# Patient Record
Sex: Female | Born: 1935 | Race: White | Hispanic: No | Smoking: Never smoker
Health system: Southern US, Community
[De-identification: ages and names within clinical notes are randomized; demographics above are authoritative.]

## PROBLEM LIST (undated history)

## (undated) ENCOUNTER — Emergency Department (HOSPITAL_COMMUNITY): Payer: Medicare Other

## (undated) DIAGNOSIS — K2100 Gastro-esophageal reflux disease with esophagitis, without bleeding: Secondary | ICD-10-CM

## (undated) DIAGNOSIS — F329 Major depressive disorder, single episode, unspecified: Secondary | ICD-10-CM

## (undated) DIAGNOSIS — E7800 Pure hypercholesterolemia, unspecified: Secondary | ICD-10-CM

## (undated) DIAGNOSIS — F32A Depression, unspecified: Secondary | ICD-10-CM

## (undated) DIAGNOSIS — F0390 Unspecified dementia without behavioral disturbance: Secondary | ICD-10-CM

## (undated) DIAGNOSIS — H3530 Unspecified macular degeneration: Secondary | ICD-10-CM

## (undated) HISTORY — PX: HERNIA REPAIR: SHX51

## (undated) HISTORY — PX: EYE SURGERY: SHX253

## (undated) HISTORY — PX: HIP SURGERY: SHX245

## (undated) HISTORY — PX: VEIN LIGATION AND STRIPPING: SHX2653

## (undated) HISTORY — PX: CARPAL TUNNEL RELEASE: SHX101

## (undated) HISTORY — DX: Unspecified macular degeneration: H35.30

---

## 1997-10-15 ENCOUNTER — Ambulatory Visit (HOSPITAL_COMMUNITY): Admission: RE | Admit: 1997-10-15 | Discharge: 1997-10-15 | Payer: Self-pay | Admitting: Obstetrics & Gynecology

## 1998-04-18 ENCOUNTER — Ambulatory Visit (HOSPITAL_COMMUNITY): Admission: RE | Admit: 1998-04-18 | Discharge: 1998-04-18 | Payer: Self-pay | Admitting: Family Medicine

## 1998-05-09 ENCOUNTER — Ambulatory Visit (HOSPITAL_COMMUNITY): Admission: RE | Admit: 1998-05-09 | Discharge: 1998-05-09 | Payer: Self-pay | Admitting: Family Medicine

## 1998-10-21 ENCOUNTER — Ambulatory Visit (HOSPITAL_COMMUNITY): Admission: RE | Admit: 1998-10-21 | Discharge: 1998-10-21 | Payer: Self-pay | Admitting: *Deleted

## 1999-05-29 ENCOUNTER — Ambulatory Visit (HOSPITAL_COMMUNITY): Admission: RE | Admit: 1999-05-29 | Discharge: 1999-05-29 | Payer: Self-pay | Admitting: Family Medicine

## 1999-11-10 ENCOUNTER — Ambulatory Visit (HOSPITAL_COMMUNITY): Admission: RE | Admit: 1999-11-10 | Discharge: 1999-11-10 | Payer: Self-pay | Admitting: Obstetrics & Gynecology

## 2000-06-11 ENCOUNTER — Encounter: Payer: Self-pay | Admitting: Obstetrics & Gynecology

## 2000-06-11 ENCOUNTER — Ambulatory Visit (HOSPITAL_COMMUNITY): Admission: RE | Admit: 2000-06-11 | Discharge: 2000-06-11 | Payer: Self-pay | Admitting: Obstetrics & Gynecology

## 2000-10-25 ENCOUNTER — Ambulatory Visit (HOSPITAL_COMMUNITY): Admission: RE | Admit: 2000-10-25 | Discharge: 2000-10-25 | Payer: Self-pay | Admitting: Obstetrics & Gynecology

## 2001-06-20 ENCOUNTER — Ambulatory Visit (HOSPITAL_COMMUNITY): Admission: RE | Admit: 2001-06-20 | Discharge: 2001-06-20 | Payer: Self-pay | Admitting: Obstetrics & Gynecology

## 2001-10-10 ENCOUNTER — Ambulatory Visit (HOSPITAL_COMMUNITY): Admission: RE | Admit: 2001-10-10 | Discharge: 2001-10-10 | Payer: Self-pay | Admitting: *Deleted

## 2002-06-23 ENCOUNTER — Encounter: Payer: Self-pay | Admitting: Family Medicine

## 2002-06-23 ENCOUNTER — Ambulatory Visit (HOSPITAL_COMMUNITY): Admission: RE | Admit: 2002-06-23 | Discharge: 2002-06-23 | Payer: Self-pay | Admitting: Family Medicine

## 2002-10-09 ENCOUNTER — Ambulatory Visit (HOSPITAL_COMMUNITY): Admission: RE | Admit: 2002-10-09 | Discharge: 2002-10-09 | Payer: Self-pay | Admitting: *Deleted

## 2004-07-30 ENCOUNTER — Emergency Department: Payer: Self-pay | Admitting: Emergency Medicine

## 2004-11-27 ENCOUNTER — Ambulatory Visit: Payer: Self-pay | Admitting: Family Medicine

## 2005-01-01 ENCOUNTER — Ambulatory Visit: Payer: Self-pay | Admitting: General Surgery

## 2006-02-13 ENCOUNTER — Ambulatory Visit: Payer: Self-pay | Admitting: Family Medicine

## 2006-04-23 ENCOUNTER — Ambulatory Visit: Payer: Self-pay | Admitting: Unknown Physician Specialty

## 2006-05-01 ENCOUNTER — Ambulatory Visit: Payer: Self-pay | Admitting: Unknown Physician Specialty

## 2006-10-08 ENCOUNTER — Encounter: Payer: Self-pay | Admitting: Unknown Physician Specialty

## 2006-11-28 ENCOUNTER — Encounter: Payer: Self-pay | Admitting: Unknown Physician Specialty

## 2006-12-04 ENCOUNTER — Ambulatory Visit: Payer: Self-pay | Admitting: Unknown Physician Specialty

## 2006-12-17 ENCOUNTER — Ambulatory Visit: Payer: Self-pay | Admitting: Unknown Physician Specialty

## 2006-12-19 ENCOUNTER — Ambulatory Visit: Payer: Self-pay | Admitting: Podiatry

## 2007-05-29 ENCOUNTER — Ambulatory Visit: Payer: Self-pay | Admitting: Family Medicine

## 2008-06-15 ENCOUNTER — Ambulatory Visit: Payer: Self-pay | Admitting: Unknown Physician Specialty

## 2008-06-15 ENCOUNTER — Ambulatory Visit: Payer: Self-pay | Admitting: Family Medicine

## 2008-08-03 ENCOUNTER — Ambulatory Visit: Payer: Self-pay | Admitting: Anesthesiology

## 2008-08-26 ENCOUNTER — Ambulatory Visit: Payer: Self-pay | Admitting: Anesthesiology

## 2008-09-13 ENCOUNTER — Ambulatory Visit: Payer: Self-pay | Admitting: Anesthesiology

## 2008-10-25 ENCOUNTER — Ambulatory Visit: Payer: Self-pay | Admitting: Anesthesiology

## 2008-11-26 ENCOUNTER — Ambulatory Visit: Payer: Self-pay | Admitting: Anesthesiology

## 2008-12-15 ENCOUNTER — Ambulatory Visit: Payer: Self-pay | Admitting: Anesthesiology

## 2009-02-07 ENCOUNTER — Ambulatory Visit: Payer: Self-pay | Admitting: Anesthesiology

## 2009-03-16 ENCOUNTER — Ambulatory Visit: Payer: Self-pay | Admitting: Anesthesiology

## 2009-05-19 ENCOUNTER — Ambulatory Visit: Payer: Self-pay | Admitting: Family Medicine

## 2009-06-16 ENCOUNTER — Ambulatory Visit: Payer: Self-pay | Admitting: Family Medicine

## 2010-07-11 ENCOUNTER — Ambulatory Visit: Payer: Self-pay | Admitting: Family Medicine

## 2011-07-18 ENCOUNTER — Ambulatory Visit: Payer: Self-pay | Admitting: Family Medicine

## 2011-07-18 LAB — LIPID PANEL
CHOLESTEROL: 181 mg/dL (ref 0–200)
HDL: 76 mg/dL — AB (ref 35–70)
LDL Cholesterol: 90 mg/dL
Triglycerides: 75 mg/dL (ref 40–160)

## 2011-08-23 ENCOUNTER — Ambulatory Visit: Payer: Self-pay | Admitting: Family Medicine

## 2012-03-17 ENCOUNTER — Ambulatory Visit: Payer: Self-pay | Admitting: Family Medicine

## 2012-04-14 ENCOUNTER — Ambulatory Visit: Payer: Self-pay | Admitting: Surgery

## 2012-04-14 DIAGNOSIS — Z01810 Encounter for preprocedural cardiovascular examination: Secondary | ICD-10-CM

## 2012-04-18 ENCOUNTER — Ambulatory Visit: Payer: Self-pay | Admitting: Surgery

## 2012-11-27 ENCOUNTER — Other Ambulatory Visit: Payer: Self-pay | Admitting: Rheumatology

## 2012-11-27 LAB — SYNOVIAL CELL COUNT + DIFF, W/ CRYSTALS
Basophil: 0 %
Crystals, Joint Fluid: NONE SEEN
Eosinophil: 1 %
Lymphocytes: 32 %
Neutrophils: 36 %
Nucleated Cell Count: 111 /mm3
Other Cells BF: 0 %
Other Mononuclear Cells: 31 %

## 2013-03-16 ENCOUNTER — Ambulatory Visit: Payer: Self-pay | Admitting: Family Medicine

## 2013-03-23 ENCOUNTER — Encounter: Payer: Self-pay | Admitting: Family Medicine

## 2013-04-08 ENCOUNTER — Encounter: Payer: Self-pay | Admitting: Family Medicine

## 2014-01-27 LAB — CBC AND DIFFERENTIAL
HCT: 38 % (ref 36–46)
Hemoglobin: 13.1 g/dL (ref 12.0–16.0)
Neutrophils Absolute: 3 /uL
Platelets: 240 10*3/uL (ref 150–399)
WBC: 6.5 10^3/mL

## 2014-01-27 LAB — BASIC METABOLIC PANEL
BUN: 27 mg/dL — AB (ref 4–21)
CREATININE: 0.9 mg/dL (ref 0.5–1.1)
Glucose: 84 mg/dL
Potassium: 4.3 mmol/L (ref 3.4–5.3)
SODIUM: 144 mmol/L (ref 137–147)

## 2014-01-27 LAB — HEPATIC FUNCTION PANEL
ALT: 19 U/L (ref 7–35)
AST: 20 U/L (ref 13–35)
Alkaline Phosphatase: 68 U/L (ref 25–125)

## 2014-01-27 LAB — TSH: TSH: 0.87 u[IU]/mL (ref 0.41–5.90)

## 2014-10-01 ENCOUNTER — Inpatient Hospital Stay: Payer: Self-pay | Admitting: Internal Medicine

## 2014-10-01 ENCOUNTER — Ambulatory Visit
Admit: 2014-10-01 | Disposition: A | Discharge status: Home / Self Care | Payer: Self-pay | Admitting: Orthopedic Surgery

## 2014-10-01 ENCOUNTER — Ambulatory Visit: Payer: Self-pay | Admitting: Family Medicine

## 2014-10-01 LAB — URINALYSIS, COMPLETE
Bacteria: NONE SEEN
Bilirubin,UR: NEGATIVE
Blood: NEGATIVE
Glucose,UR: NEGATIVE mg/dL (ref 0–75)
Hyaline Cast: 8
Ketone: NEGATIVE
Leukocyte Esterase: NEGATIVE
Nitrite: NEGATIVE
Ph: 5 (ref 4.5–8.0)
Protein: NEGATIVE
RBC,UR: NONE SEEN /HPF (ref 0–5)
Specific Gravity: 1.024 (ref 1.003–1.030)
Squamous Epithelial: 1
WBC UR: NONE SEEN /HPF (ref 0–5)

## 2014-10-01 LAB — COMPREHENSIVE METABOLIC PANEL
Albumin: 3.6 g/dL
Alkaline Phosphatase: 76 U/L
Anion Gap: 6 — ABNORMAL LOW (ref 7–16)
BUN: 25 mg/dL — ABNORMAL HIGH
Bilirubin,Total: 0.9 mg/dL
Calcium, Total: 8.8 mg/dL — ABNORMAL LOW
Chloride: 107 mmol/L
Co2: 27 mmol/L
Creatinine: 0.71 mg/dL
EGFR (African American): >60
EGFR (Non-African Amer.): >60
Glucose: 100 mg/dL — ABNORMAL HIGH
Potassium: 3.5 mmol/L
SGOT(AST): 25 U/L
SGPT (ALT): 19 U/L
Sodium: 140 mmol/L
Total Protein: 6.7 g/dL

## 2014-10-01 LAB — CBC
HCT: 39.9 % (ref 35.0–47.0)
HGB: 13.1 g/dL (ref 12.0–16.0)
MCH: 29.5 pg (ref 26.0–34.0)
MCHC: 32.8 g/dL (ref 32.0–36.0)
MCV: 90 fL (ref 80–100)
Platelet: 241 10*3/uL (ref 150–440)
RBC: 4.43 10*6/uL (ref 3.80–5.20)
RDW: 13.4 % (ref 11.5–14.5)
WBC: 7.4 10*3/uL (ref 3.6–11.0)

## 2014-10-01 LAB — PROTIME-INR
INR: 1.1
Prothrombin Time: 14.3 secs

## 2014-10-01 LAB — APTT: Activated PTT: 29.7 secs (ref 23.6–35.9)

## 2014-10-02 LAB — CBC WITH DIFFERENTIAL/PLATELET
BASOS PCT: 1.2 %
Basophil #: 0.1 10*3/uL (ref 0.0–0.1)
EOS ABS: 0.2 10*3/uL (ref 0.0–0.7)
Eosinophil %: 3.4 %
HCT: 38.5 % (ref 35.0–47.0)
HGB: 12.7 g/dL (ref 12.0–16.0)
LYMPHS ABS: 2.1 10*3/uL (ref 1.0–3.6)
Lymphocyte %: 32.1 %
MCH: 29.6 pg (ref 26.0–34.0)
MCHC: 32.9 g/dL (ref 32.0–36.0)
MCV: 90 fL (ref 80–100)
MONO ABS: 0.8 x10 3/mm (ref 0.2–0.9)
Monocyte %: 12 %
NEUTROS ABS: 3.3 10*3/uL (ref 1.4–6.5)
Neutrophil %: 51.3 %
Platelet: 245 10*3/uL (ref 150–440)
RBC: 4.28 10*6/uL (ref 3.80–5.20)
RDW: 13.4 % (ref 11.5–14.5)
WBC: 6.4 10*3/uL (ref 3.6–11.0)

## 2014-10-02 LAB — BASIC METABOLIC PANEL
Anion Gap: 5 — ABNORMAL LOW (ref 7–16)
BUN: 17 mg/dL
CALCIUM: 8.8 mg/dL — AB
Chloride: 108 mmol/L
Co2: 26 mmol/L
Creatinine: 0.57 mg/dL
EGFR (African American): >60
Glucose: 94 mg/dL
Potassium: 3.2 mmol/L — ABNORMAL LOW
Sodium: 139 mmol/L

## 2014-10-03 LAB — CBC WITH DIFFERENTIAL/PLATELET
BASOS PCT: 0.7 %
Basophil #: 0.1 10*3/uL (ref 0.0–0.1)
EOS PCT: 1 %
Eosinophil #: 0.1 10*3/uL (ref 0.0–0.7)
HCT: 39.5 % (ref 35.0–47.0)
HGB: 12.9 g/dL (ref 12.0–16.0)
Lymphocyte #: 1.4 10*3/uL (ref 1.0–3.6)
Lymphocyte %: 15.3 %
MCH: 29.3 pg (ref 26.0–34.0)
MCHC: 32.8 g/dL (ref 32.0–36.0)
MCV: 89 fL (ref 80–100)
MONOS PCT: 13.2 %
Monocyte #: 1.2 x10 3/mm — ABNORMAL HIGH (ref 0.2–0.9)
Neutrophil #: 6.3 10*3/uL (ref 1.4–6.5)
Neutrophil %: 69.8 %
PLATELETS: 257 10*3/uL (ref 150–440)
RBC: 4.42 10*6/uL (ref 3.80–5.20)
RDW: 13.1 % (ref 11.5–14.5)
WBC: 8.9 10*3/uL (ref 3.6–11.0)

## 2014-10-03 LAB — BASIC METABOLIC PANEL
ANION GAP: 7 (ref 7–16)
BUN: 10 mg/dL
CALCIUM: 8.5 mg/dL — AB
Chloride: 104 mmol/L
Co2: 27 mmol/L
Creatinine: 0.47 mg/dL
EGFR (African American): >60
EGFR (Non-African Amer.): >60
Glucose: 117 mg/dL — ABNORMAL HIGH
Potassium: 3 mmol/L — ABNORMAL LOW
Sodium: 138 mmol/L

## 2014-10-04 LAB — CBC WITH DIFFERENTIAL/PLATELET
Basophil #: 0.1 10*3/uL (ref 0.0–0.1)
Basophil %: 0.9 %
EOS ABS: 0.2 10*3/uL (ref 0.0–0.7)
Eosinophil %: 2.3 %
HCT: 34.7 % — AB (ref 35.0–47.0)
HGB: 11.5 g/dL — ABNORMAL LOW (ref 12.0–16.0)
LYMPHS ABS: 1.9 10*3/uL (ref 1.0–3.6)
LYMPHS PCT: 25 %
MCH: 29.6 pg (ref 26.0–34.0)
MCHC: 33.1 g/dL (ref 32.0–36.0)
MCV: 89 fL (ref 80–100)
Monocyte #: 1 x10 3/mm — ABNORMAL HIGH (ref 0.2–0.9)
Monocyte %: 13.2 %
NEUTROS ABS: 4.5 10*3/uL (ref 1.4–6.5)
Neutrophil %: 58.6 %
Platelet: 262 10*3/uL (ref 150–440)
RBC: 3.88 10*6/uL (ref 3.80–5.20)
RDW: 13.1 % (ref 11.5–14.5)
WBC: 7.7 10*3/uL (ref 3.6–11.0)

## 2014-10-04 LAB — BASIC METABOLIC PANEL
Anion Gap: 7 (ref 7–16)
BUN: 11 mg/dL
CO2: 24 mmol/L
Calcium, Total: 8.3 mg/dL — ABNORMAL LOW
Chloride: 108 mmol/L
Creatinine: 0.55 mg/dL
EGFR (African American): >60
GLUCOSE: 97 mg/dL
Potassium: 3.3 mmol/L — ABNORMAL LOW
Sodium: 139 mmol/L

## 2014-10-04 LAB — MAGNESIUM: Magnesium: 1.7 mg/dL

## 2014-10-05 ENCOUNTER — Encounter
Admit: 2014-10-05 | Disposition: A | Discharge status: Home / Self Care | Payer: Self-pay | Attending: Internal Medicine | Admitting: Internal Medicine

## 2014-10-05 LAB — CBC WITH DIFFERENTIAL/PLATELET
BASOS ABS: 0.1 10*3/uL (ref 0.0–0.1)
BASOS PCT: 0.7 %
EOS ABS: 0.1 10*3/uL (ref 0.0–0.7)
Eosinophil %: 1.1 %
HCT: 34.2 % — AB (ref 35.0–47.0)
HGB: 11.6 g/dL — ABNORMAL LOW (ref 12.0–16.0)
LYMPHS PCT: 22.8 %
Lymphocyte #: 2 10*3/uL (ref 1.0–3.6)
MCH: 30.2 pg (ref 26.0–34.0)
MCHC: 34 g/dL (ref 32.0–36.0)
MCV: 89 fL (ref 80–100)
MONOS PCT: 13 %
Monocyte #: 1.1 x10 3/mm — ABNORMAL HIGH (ref 0.2–0.9)
Neutrophil #: 5.4 10*3/uL (ref 1.4–6.5)
Neutrophil %: 62.4 %
Platelet: 300 10*3/uL (ref 150–440)
RBC: 3.85 10*6/uL (ref 3.80–5.20)
RDW: 13 % (ref 11.5–14.5)
WBC: 8.7 10*3/uL (ref 3.6–11.0)

## 2014-10-05 LAB — BASIC METABOLIC PANEL
Anion Gap: 6 — ABNORMAL LOW (ref 7–16)
BUN: 8 mg/dL
CHLORIDE: 106 mmol/L
CREATININE: 0.55 mg/dL
Calcium, Total: 8.5 mg/dL — ABNORMAL LOW
Co2: 25 mmol/L
EGFR (African American): >60
EGFR (Non-African Amer.): >60
Glucose: 114 mg/dL — ABNORMAL HIGH
Potassium: 3.6 mmol/L
SODIUM: 137 mmol/L

## 2014-10-08 ENCOUNTER — Encounter
Admit: 2014-10-08 | Disposition: A | Discharge status: Home / Self Care | Payer: Self-pay | Attending: Internal Medicine | Admitting: Internal Medicine

## 2014-10-09 LAB — URINALYSIS, COMPLETE
BILIRUBIN, UR: NEGATIVE
Bacteria: NONE SEEN
Blood: NEGATIVE
GLUCOSE, UR: NEGATIVE mg/dL (ref 0–75)
Nitrite: NEGATIVE
Ph: 5 (ref 4.5–8.0)
Protein: 30
RBC,UR: 3 /HPF (ref 0–5)
SPECIFIC GRAVITY: 1.028 (ref 1.003–1.030)
Squamous Epithelial: 7
Transitional Epi: 2

## 2014-10-11 LAB — URINE CULTURE

## 2014-10-26 NOTE — Op Note (Signed)
PATIENT NAME:  Lynn [name], Lynn [name] MR#:  [id number] DATE OF BIRTH:  25-Jan-1927  DATE OF PROCEDURE:  04/18/2012  PREOPERATIVE DIAGNOSIS: Left inguinal hernia.   POSTOPERATIVE DIAGNOSIS: Left inguinal hernia.   PROCEDURE: Left inguinal hernia repair.   SURGEON: Renda RollsWilton Smith, MD   ANESTHESIA: General.   INDICATIONS: This 81 year old female recently has had bulging in the left groin, and an inguinal hernia was demonstrated on physical exam and repair recommended for definitive treatment.   DESCRIPTION OF PROCEDURE: The patient was placed on the operating table in the supine position under general anesthesia. The abdomen was prepared with ChloraPrep and draped in [name] sterile manner. [name] left lower quadrant transversely oriented suprapubic incision was made some 4 cm in length, carried down through subcutaneous tissues. One traversing vein was divided between 4-0 chromic ligatures. Scarpa's fascia was incised. Numerous small bleeding points were cauterized. The external oblique aponeurosis was incised along the course of its fibers exposing an indirect hernia sac. The sac and round ligament were dissected free from surrounding structures. The round ligament was ligated distally with 4-0 Vicryl suture ligature and divided. The sac was dissected free from its surrounding structures. It was opened. Its continuity with the peritoneal cavity was demonstrated. The sac was some 5 cm in length and was dissected up into the internal ring. [name] high ligation of the sac and the round ligament was done with [name] 4-0 Vicryl suture ligature. The sac was excised along with [name] portion of round ligament that did not need to be sent for pathology. The stump was allowed to retract. Next, the repair was carried out with [name] row of 0 Surgilon sutures suturing the conjoined tendon to the shelving edge of the inguinal ligament, incorporating transversalis fascia into the repair. The last stitch led to obliteration of the internal ring. Next, the  cut edges of the external oblique aponeurosis were closed with running 4-0 Vicryl. The fascia superior and lateral to the repair site was infiltrated with 0.5% Sensorcaine with epinephrine. Subcutaneous tissues were infiltrated. Next, the Scarpa's fascia was closed with interrupted 4-0 Vicryl. The skin was closed with [name] running 5-0 Monocryl subcuticular suture and Dermabond. The patient tolerated surgery satisfactorily and was then prepared for transfer to the recovery room.    ____________________________ Shela CommonsJ. Renda RollsWilton Smith, MD jws:cbb D: 04/18/2012 10:48:30 ET T: 04/18/2012 10:59:48 ET JOB#: [id number]  cc: Adella HareJ. Wilton Smith, MD, <Dictator> Adella HareWILTON J SMITH MD ELECTRONICALLY SIGNED 04/25/2012 18:53

## 2014-11-07 ENCOUNTER — Encounter
Admission: RE | Admit: 2014-11-07 | Discharge: 2014-11-07 | Disposition: A | Discharge status: Home / Self Care | Payer: Commercial Managed Care - HMO | Source: Ambulatory Visit | Attending: Internal Medicine | Admitting: Internal Medicine

## 2014-11-07 NOTE — Consult Note (Signed)
Brief Consult Note: Diagnosis: L femoral neck fracture.   Patient was seen by consultant.   Recommend to proceed with surgery or procedure.   Comments: HPI:  [age] F with no PMH, reports falling 2 weeks ago while taking care of her husband. She states she fell onto her left hip and back, but denies any pain at the time. Denies head trauma, denies LOC, denies presyncope. She was able to ambulate but noted over the coming days that she had left hip pain when ambulating. She eventually needed a walker to ambulate, and today went to see her PCP who obtained an Xray that revealed an impacted L hip femoral neck fracture, and she was sent to the ED.  She lives at home, takes care of herself and her husband, and ambulates without assistive devices at baseline.   PMH: Denies PSH: R hernia repair 2 years ago NKDA  PE:  LLE - no deformity of leg with near symmetric leg lengths Scissoring of toes, varicose veins in lower leg Unable to SLR Mild pain with log roll, no pain with axial load DP2+ PT 2+ No calf pain, compartments soft 5/5 EHL/FHL/TA/GS SILT DP/SP/T BCR  Xray - L hip with valgus impacted femoral neck fracture  Impression: L hip femoral neck fracture, valgus impacted  Plan: ORIF with cannulated screws -NPO after midnight -Hold DVT chemoprophylaxis -Preop labs - needs T&S for OR  -Foley catheter -Medical clearance -Bed rest -Pain control -informed consent was obtained, I had a long discussion with her regarding the risks, benefits and alternatives to cannulated screw fixation of her left hip. I explained to her the risk of eventual conversion to hemiarthroplasty or total hip arthroplasty if cannulated screw fixation fails or AVN develops. There is also a small chance the hip fracture displaced and this is noticed on the OR table, and hemiarthroplasty must be done instead of cannulated screw fixation. She verbalized understanding, all questions were answered.  Electronic  Signatures: Deatra JamesKahn, Machai Desmith D (MD)  (Signed 26-Mar-16 00:15)  Authored: Brief Consult Note   Last Updated: 26-Mar-16 00:15 by Deatra JamesKahn, Ihsan Nomura D (MD)

## 2014-11-07 NOTE — Op Note (Signed)
Patient: This 81 year old Female had a surgical procedure performed on 02-Oct-2014.  Post Operative Report:  Pre-Op Diagnosis L femoral neck fracture   Post-Op Diagnosis Same   Operation L hip cannulated screw fixation   Anesthesia Spinal   Specimen Type None   Findings impacted femoral neck fracture   Surgeon Andee PolesKahn, Advika Mclelland   EBL: Minimal   Complications None   Description of Procedure: Mrs. Lynn [name] is an [age] F who after a fall 2 weeks ago developed worsening L hip pain, and was diagnosed with an impacted L femoral neck fracture. After discussing the r/b/a, in order to prevent further displacement and allow early mobilization she was indicated for percutaneous screw fixation.   Consents and clearances were reviewed on the day of surgery. The surgical site was marked, and after being evaluated by anesthesia the patient was taken to the OR. In the OR she recieved spinal anesthesia and was transferred to the fracture table and positioned with the L leg in gentle traction. The R leg was padded and secured to the beam of the bed.  The L leg was prepped and draped and a time out was performed. Fluoroscopy showed the fracture remained impacted. Using a guide pin for the 7.3 mm cannulated screw set, an inferior screw was planned with fluoroscopy and once a starting point was chosen along the lateral femoral cortex above the level of the inferior aspect of the lesser trochanter a small stab incision was made and the pin was placed against the femur, and angled to project along the neck into the head of the femur. When the angle was sufficient on the AP xray, the guide pin was driven into the head of the femur along the calcar using a pin driver. Lateral fluoroscopy showed the pin to be well placed and aligned. It was driven further so it was close but not through the femoral head on both the AP and lateral views. THen a second superior wire was placed in a similar fashion, superior and anterior to the  first. With it well placed on AP and lateral xrays, a third wire was placed using the same technique superior and posterior to the first, and directly posterior to the anterior superior wire and aligned in the same plane. On the lateral view the inferior and superior pins were aligned in the same plane. With all three wires placed and location and depth appropriate, stab incisions were lengthened, and depth guage was used to plan for screw lengths. An inferior 80 mm screw was placed with 16 mm threads and a washer. It was placed under power and not tightened completely. The superior screws, measuring 75 mm in length, with 16 mm threads and washers, were placed one by one. None of the screws were tightened until all three were in place, then they were tightened one by one using a hand driver. Fluoroscopy in multiple views showed the implants well positioned within the neck and femoral head. Wounds were irrigated, and closed with 2-0 vicryl and staples.  Sterile compressive dressing was placed, sponge and needle counts were correct at the end of the case. The patinet was taken to the recovery room in stable condition   Electronic Signatures: Deatra JamesKahn, Michi Herrmann D (MD)  (Signed 26-Mar-16 22:49)  Authored: Patient and Date/Time, Operative Note   Last Updated: 26-Mar-16 22:49 by Deatra JamesKahn, Kynisha Memon D (MD)

## 2014-11-07 NOTE — Consult Note (Signed)
Brief Consult Note: Diagnosis: L hip femoral neck fracture.   Comments: Postop check L femoral neck fracture  Patient reports doing well, says pain improved since surgery. Nursing report of some confusion, though on my exam she recognized me as her surgeon and was AAOx3. She reports some discomfort in the hip and is requesting pain medication. She denies CP/SOB.   PE: VS reviewed LLE - dressing c/d/i Motor 5/5 EHL/FHL/TA/GS SILT DP/SP/T BCR, DP 2+  Impression: s/p L femoral neck cannulated screw fixation -WBAT with PT -OOBTC -Incentive spirometry  -DVT chemoprophylaxis for 2 weeks postop -Pain control - limit narcotics  -DC Foley Sunday once OOB -DC planning - will need SNF placement.  Electronic Signatures: Andee PolesKahn, Lina Hitch D (MD)  (Signed 26-Mar-16 22:22)  Authored: Brief Consult Note   Last Updated: 26-Mar-16 22:22 by Deatra JamesKahn, Nara Paternoster D (MD)

## 2014-11-07 NOTE — Consult Note (Signed)
Brief Consult Note: Diagnosis: L femoral neck fracture.   Comments: L hip cannulated screw fixation POD1  Nursing reports of some confusion overnight, moved closer to nursing desk for frequent reorientation. Patient reports doing well, denies pain in L hip. She thinks she is at home, but easily reoriented. She denies CP/SOB.   PE: VS reviewed LLE - dressing c/d/i Motor 5/5 EHL/FHL/TA/GS SILT DP/SP/T BCR, DP 2+ Labs reviewed, K=3.0 Impression: s/p L femoral neck cannulated screw fixation -electrolyte repletion  -IV ancef x 24 hrs postop -WBAT with PT -OOBTC -Incentive spirometry  -DVT chemoprophylaxis for 2 weeks postop -Pain control - limit narcotics  -DC Foley if OOB today -DC planning - will need SNF placement..  Electronic Signatures: Deatra JamesKahn, Quintel Mccalla D (MD)  (Signed 27-Mar-16 08:57)  Authored: Brief Consult Note   Last Updated: 27-Mar-16 08:57 by Deatra JamesKahn, Tiari Andringa D (MD)

## 2014-11-07 NOTE — Discharge Summary (Signed)
PATIENT NAME:  Lynn [name], Lynn [name] MR#:  [id number] DATE OF BIRTH:  1926/11/05  DATE OF ADMISSION:  10/01/2014 DATE OF DISCHARGE:  10/05/2014  PRIMARY CARE PHYSICIAN:  Dr. Sullivan LoneGilbert.  DISCHARGE DIAGNOSES:  1.  Left femoral neck fracture.  2.  Gastroesophageal reflux disease.   PROCEDURE:  Internal fixation of [name] hip fracture on the left.   CONDITION: Stable.   CODE STATUS: Full.   HOME MEDICATIONS: Please refer to the medication reconciliation list.   DIET: Regular.   ACTIVITY: As tolerated.  FOLLOWUP CARE:  With PCP and orthopedic surgeon, Dr. Park BreedKahn within 1 to 2 weeks.   REASON FOR ADMISSION: Left leg pain.   HOSPITAL COURSE: The patient is an 81 year old Caucasian female with [name] history of GERD who presented to the ED with neck pain. The patient fell about 2 weeks ago before this admission. For detailed history and physical examination, please refer to the admission note dictated by Dr. Clint GuyHower. On admission date, the patient's x-ray showed left hip fracture.  After admission, the patient got internal fixation of her left hip fracture.  Placed on deep vein thrombosis prophylaxis with pain control. The patient had [name] short period of delirium possibly due to pain medication which resolved. The patient is alert, awake, vital signs are stable. She is clinically stable and will be discharged to [name] facility today. I discussed the patient's discharge plan with the patient, nurse, case Production designer, theatre/television/filmmanager and Child psychotherapistsocial worker.  TIME SPENT: About 36 minutes.    ____________________________ Shaune PollackQing Tayt Moyers, MD qc:sp D: 10/05/2014 11:58:51 ET T: 10/05/2014 12:17:52 ET JOB#: [id number]  cc: Shaune PollackQing Mckinley Adelstein, MD, <Dictator> Shaune PollackQING Tanicia Wolaver MD ELECTRONICALLY SIGNED 10/05/2014 19:10

## 2014-11-07 NOTE — Consult Note (Signed)
Brief Consult Note: Diagnosis: L femoral neck fracture.   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 She denies CP/SOB.   NAD, AAOx3 vitals reviewed LLE - no gross deformity pain with log roll 5/5 EHL/FHL/TA/gS SILT DP/SP/T BCR  Impression: L hip femoral neck fracture Plan For OR today Medical notes reviewed AM labs reviewed Marked for OR Continue NPO, hold chemoprophylaxis Ancef ordered and held for OR.  Electronic Signatures: Deatra JamesKahn, Yalena Colon D (MD)  (Signed 26-Mar-16 08:27)  Authored: Brief Consult Note   Last Updated: 26-Mar-16 08:27 by Deatra JamesKahn, Herberta Pickron D (MD)

## 2014-11-07 NOTE — H&P (Signed)
PATIENT NAME:  Lynn [name], Lynn [name] MR#:  [id number] DATE OF BIRTH:  [birth year]  DATE OF ADMISSION:  10/01/2014  REFERRING PHYSICIAN: Eartha Inch. York Cerise, MD  PRIMARY CARE PHYSICIAN: Richard L. Sullivan Lone, MD   CHIEF COMPLAINT: Left leg pain.   HISTORY OF PRESENT ILLNESS: An 81 year old Caucasian female with past medical history of gastroesophageal reflux disease without esophagitis presenting with left leg pain. She had suffered a mechanical fall about 2 weeks ago, had persistent pain in the left hip, mainly with ambulation. Described only as "pain," nonradiating, intensity 7/10, worsening with ambulation. No relieving factors. She finally saw her PCP for above symptoms, had an x-ray, found to have a left formal neck fracture, thus presented to the hospital for further workup and evaluation.   REVIEW OF SYSTEMS:  CONSTITUTIONAL: Denies fevers, chills, fatigue, or weakness EYES:  Denies blurry vision, double vision, eye pain.  EAR, NOSE, AND THROAT: Denies tinnitus, ear pain, or hearing loss. RESPIRATORY: Denies cough, wheeze, shortness of breath.  CARDIOVASCULAR: Denies chest pain, palpitations, edema.  GASTROINTESTINAL: Denies nausea, vomiting, diarrhea, or abdominal pain.  GENITOURINARY: Denies dysuria or hematuria.  ENDOCRINE: Denies nocturia or thyroid problems.  HEMATOLOGIC AND LYMPHATIC: Denies easy bruising, bleeding.  SKIN: Denies rash or lesion.  MUSCULOSKELETAL: Positive for pain in the left leg and left hip, as described above. Otherwise, denies pain in neck, back, shoulder, knees, or further arthritic symptoms.  NEUROLOGIC: Denies paralysis, paresthesias.  PSYCHIATRIC: Denies anxiety or depressive symptoms. Otherwise, a full review of systems performed by me is negative.  PAST MEDICAL HISTORY: Includes gastroesophageal reflux disease without esophagitis as well as osteoarthritis.   SOCIAL HISTORY: No alcohol, tobacco, or drug usage. Normally fully ambulatory; however, given her recent  injury has been using a walker.   FAMILY HISTORY: No known cardiovascular or pulmonary disorders.   ALLERGIES: SULFA DRUGS.   HOME MEDICATIONS: Include meclizine 25 mg daily as needed, CoQ10 at 100 mg p.o. b.i.d., Prilosec 20 mg p.o. daily, vitamin B12 at 1000 mcg daily, calcium 600+ D 200 one tablet b.i.d., multivitamins 1 capsule daily, PreserVision multivitamin 1 tablet p.o. b.i.d.   PHYSICAL EXAMINATION:  VITAL SIGNS: Temperature 98.1, heart rate 76, respirations 20, blood pressure 160/67, saturating 98% on room air. Weight 50.7 kg, BMI 19.2.  GENERAL: A frail-appearing Caucasian female currently in no acute distress.  HEAD: Normocephalic, atraumatic.  EYES: Pupils equal, round, reactive to light. Extraocular muscles intact. No scleral icterus.  MOUTH: Moist mucosal membrane. Dentition intact. No abscess noted. EAR, NOSE, AND THROAT: Clear without exudates. No external lesions.  NECK: Supple. No thyromegaly. No nodules. No JVD.  PULMONARY: Clear to auscultation bilaterally without wheezes, rales, or rhonchi. No sue of accessory muscles. Good respiratory effort. CHEST: Nontender to palpation.  CARDIOVASCULAR: S1 and S2 regular rate and rhythm. No murmurs, rubs, or gallops. No edema. Pedal pulses 2+ bilaterally. GASTROINTESTINAL: Soft, nontender, nondistended. No masses. Positive bowel sounds. No hepatosplenomegaly.  MUSCULOSKELETAL: No swelling, clubbing, or edema. Range of motion limited in left lower extremity given pain from fracture.  NEUROLOGIC: Cranial nerves II-XII intact. No gross focal neurological deficits. Sensation intact. Reflexes intact.  SKIN: No ulceration, lesions, rashes, or cyanosis. Skin warm, dry. Turgor intact.  PSYCHIATRIC: Mood and affect within normal limits. The patient is awake, alert, oriented x 3. Insight and judgment intact.   LABORATORY DATA: Sodium of 140, potassium 3.5, chloride 107, bicarbonate 27, BUN 25, creatinine 0.71, glucose of 100. LFTs are within  normal limits. WBC of 7.4, hemoglobin 13.1, platelets of  241,000. Urinalysis: No evidence of infection. EKG performed, normal sinus rhythm. No ST or T wave abnormality.   ASSESSMENT AND PLAN: An 81 year old Caucasian female with history of gastroesophageal reflux disease without esophagitis, presenting with left leg pain after mechanical fall.  1.  Preoperative evaluation for open reduction internal fixation of left femoral neck fracture. METs would actually be considered less than 4 given generalized dismobility. She has no active cardiac symptoms, including chest pain or anginal equivalents. No evidence of congestive heart failure, significant valvular dysfunction, or significant arrhythmias. She will require no further testing or intervention prior to surgery. We will formally consult orthopedics. Dr.khan has already evaluated the patient in the Emergency Department. We will provide morphine for pain control and initiate bowel regimen.  2.  Gastroesophageal reflux disease without esophagitis. Proton pump inhibitor therapy. 3.  Venous thromboembolism prophylaxis. Sequential compression devices.   CODE STATUS: The patient is full code.   TIME SPENT: 35 minutes.   ____________________________ Cletis Athensavid K. Hower, MD dkh:bm D: 10/01/2014 23:38:17 ET T: 10/02/2014 00:15:30 ET JOB#: [id number]  cc: Cletis Athensavid K. Hower, MD, <Dictator> DAVID Synetta ShadowK HOWER MD ELECTRONICALLY SIGNED 10/02/2014 1:38

## 2014-11-20 DIAGNOSIS — S72009A Fracture of unspecified part of neck of unspecified femur, initial encounter for closed fracture: Secondary | ICD-10-CM | POA: Insufficient documentation

## 2014-11-20 DIAGNOSIS — S72002D Fracture of unspecified part of neck of left femur, subsequent encounter for closed fracture with routine healing: Secondary | ICD-10-CM | POA: Insufficient documentation

## 2014-12-08 ENCOUNTER — Emergency Department
Admission: EM | Admit: 2014-12-08 | Discharge: 2014-12-08 | Disposition: A | Discharge status: Home / Self Care | Payer: BC Managed Care – PPO | Attending: Student | Admitting: Student

## 2014-12-08 ENCOUNTER — Encounter: Payer: Self-pay | Admitting: Emergency Medicine

## 2014-12-08 ENCOUNTER — Emergency Department: Payer: BC Managed Care – PPO

## 2014-12-08 DIAGNOSIS — S7002XA Contusion of left hip, initial encounter: Secondary | ICD-10-CM | POA: Diagnosis not present

## 2014-12-08 DIAGNOSIS — W1839XA Other fall on same level, initial encounter: Secondary | ICD-10-CM | POA: Insufficient documentation

## 2014-12-08 DIAGNOSIS — Y9389 Activity, other specified: Secondary | ICD-10-CM | POA: Diagnosis not present

## 2014-12-08 DIAGNOSIS — Y9289 Other specified places as the place of occurrence of the external cause: Secondary | ICD-10-CM | POA: Diagnosis not present

## 2014-12-08 DIAGNOSIS — W19XXXA Unspecified fall, initial encounter: Secondary | ICD-10-CM

## 2014-12-08 DIAGNOSIS — Z79899 Other long term (current) drug therapy: Secondary | ICD-10-CM | POA: Diagnosis not present

## 2014-12-08 DIAGNOSIS — S79912A Unspecified injury of left hip, initial encounter: Secondary | ICD-10-CM | POA: Diagnosis present

## 2014-12-08 DIAGNOSIS — Y998 Other external cause status: Secondary | ICD-10-CM | POA: Diagnosis not present

## 2014-12-08 MED ORDER — ACETAMINOPHEN 500 MG PO TABS
1000.0000 mg | ORAL_TABLET | Freq: Once | ORAL | Status: AC
  Administered 2014-12-08: 1000 mg via ORAL

## 2014-12-08 MED ORDER — ACETAMINOPHEN 500 MG PO TABS
ORAL_TABLET | ORAL | Status: AC
  Administered 2014-12-08: 1000 mg via ORAL
  Filled 2014-12-08: qty 2

## 2014-12-08 NOTE — ED Provider Notes (Addendum)
[facility] Emergency Department Provider Note  ____________________________________________  Time seen: Approximately 9:43 AM  I have reviewed the triage vital signs and the nursing notes.   HISTORY  Chief Complaint Fall and Hip Pain    HPI Lynn [name] is a 81 y.o. female with history of GERD, status post left femoral neck cannulation screw fixation on 10/02/2014 for left femoral neck fracture who presents for evaluation of fall with left hip pain. Patient rolled over and fell from bed at approximately 2:30 this morning. EMS was called however she refused transport at that time. Just prior to arrival, when her son came to visit her, she was complaining of left hip pain and was sent here for evaluation. Currently she has minimal pain through it has been constant since onset. It is exacerbated by movement at the left hip. She reports that prior to today she had been in her usual state of health. Denies hitting her head or losing consciousness, she denies any neck pain.   History reviewed. No pertinent past medical history.  There are no active problems to display for this patient.   Past Surgical History  Procedure Laterality Date  . Hip surgery    . Eye surgery      Current Outpatient Rx  Name  Route  Sig  Dispense  Refill  . acetaminophen (TYLENOL) 500 MG tablet   Oral   Take 500 mg by mouth every 6 (six) hours as needed for moderate pain.          . brinzolamide (AZOPT) 1 % ophthalmic suspension   Both Eyes   Place 1 drop into both eyes 3 (three) times daily.         . Cholecalciferol (VITAMIN D-3) 1000 UNITS CAPS   Oral   Take 1 capsule by mouth daily.         . Multiple Vitamins-Minerals (MULTIVITAMIN ADULT PO)   Oral   Take 1 tablet by mouth daily.         Marland Kitchen omeprazole (PRILOSEC) 20 MG capsule   Oral   Take 20 mg by mouth daily.         Marland Kitchen oxyCODONE (OXY IR/ROXICODONE) 5 MG immediate release tablet   Oral   Take 5-10 mg by  mouth every 4 (four) hours as needed for severe pain.         Marland Kitchen sertraline (ZOLOFT) 50 MG tablet   Oral   Take 50 mg by mouth daily.           Allergies Sulfa antibiotics  No family history on file.  Social History History  Substance Use Topics  . Smoking status: Never Smoker   . Smokeless tobacco: Not on file  . Alcohol Use: No    Review of Systems Constitutional: No fever/chills Eyes: No visual changes. ENT: No sore throat. Cardiovascular: Denies chest pain. Respiratory: Denies shortness of breath. Gastrointestinal: No abdominal pain.  No nausea, no vomiting.  No diarrhea.  No constipation. Genitourinary: Negative for dysuria. Musculoskeletal: Negative for back pain. Skin: Negative for rash. Neurological: Negative for headaches, focal weakness or numbness.  10-point ROS otherwise negative.  ____________________________________________   PHYSICAL EXAM:  VITAL SIGNS: ED Triage Vitals  Enc Vitals Group     BP 12/08/14 0942 116/73 mmHg     Pulse Rate 12/08/14 0942 82     Resp 12/08/14 0942 18     Temp 12/08/14 0942 98.1 F (36.7 C)     Temp Source 12/08/14 9147  Oral     SpO2 12/08/14 0942 96 %     Weight 12/08/14 0942 101 lb 11.2 oz (46.131 kg)     Height 12/08/14 0942 5\' 2"  (1.575 m)     Head Cir --      Peak Flow --      Pain Score --      Pain Loc --      Pain Edu? --      Excl. in GC? --     Constitutional: Alert and oriented. Well appearing and in no acute distress. Eyes: Conjunctivae are normal. PERRL. EOMI. Head: Atraumatic. Nose: No congestion/rhinnorhea. Mouth/Throat: Mucous membranes are moist.  Oropharynx non-erythematous. Neck: No stridor.No cervical spine tenderness to palpation. Cardiovascular: Normal rate, regular rhythm. Grossly normal heart sounds.  Good peripheral circulation. Respiratory: Normal respiratory effort.  No retractions. Lungs CTAB. Gastrointestinal: Soft and nontender. No distention. No abdominal bruits. No CVA  tenderness. Genitourinary: deferred Musculoskeletal: Pelvis is stable to rock and compression, mild tenderness throughout the left hip with slightly painful although full passive range of motion, the left leg is shortened, 2+ dorsalis pedis pulses bilaterally, no midline tenderness to palpation throughout the T or L-spine Neurologic:  Normal speech and language. No gross focal neurologic deficits are appreciated. Speech is normal.  Skin:  Skin is warm, dry and intact. No rash noted. Psychiatric: Mood and affect are normal. Speech and behavior are normal.  ____________________________________________   LABS (all labs ordered are listed, but only abnormal results are displayed)  Labs Reviewed - No data to display ____________________________________________  EKG  none ____________________________________________  RADIOLOGY  Xray left hip FINDINGS: Frontal pelvis as well as frontal and lateral left hip images were obtained. There are screws transfixing a previous subcapital femoral neck fracture on the left. There is foreshortening of the left femoral neck with varus angulation in this area.  There is no acute fracture or dislocation. There is moderate narrowing of both hip joints. There is postoperative change in the pelvis.  IMPRESSION: Postoperative change in the proximal left femur with remodeling. No acute fracture or dislocation. Moderate narrowing both hip joints. ____________________________________________   PROCEDURES  Procedure(s) performed: None  Critical Care performed: No  ____________________________________________   INITIAL IMPRESSION / ASSESSMENT AND PLAN / ED COURSE  Pertinent labs & imaging results that were available during my care of the patient were reviewed by me and considered in my medical decision making (see chart for details).  Lynn [name] is a 81 y.o. female with history of GERD, status post left femoral neck cannulation screw  fixation on 10/02/2014 for left femoral neck fracture who presents for evaluation of fall with left hip pain. She is neurovascularly intact in the left leg with mild shortening. X-rays negative for fracture although postoperative changes noted. Patient currently has no pain. She is not ambulatory at baseline/is wheelchair bound and only stands to transfer to her wheelchair, which she is able to do currently. Remainder of her examination is atraumatic. Return precautions discussed with her family, DC home with orthopedic surgery follow-up. She has round-the-clock care at home and is suitable for discharge.  ____________________________________________   FINAL CLINICAL IMPRESSION(S) / ED DIAGNOSES  Final diagnoses:  Fall  Contusion, hip, left, initial encounter      Gayla DossEryka A Adley Castello, MD 12/08/14 1116  Gayla DossEryka A Braxtin Bamba, MD 12/08/14 [phone]

## 2014-12-08 NOTE — ED Notes (Signed)
Patient transported to X-ray 

## 2014-12-08 NOTE — ED Notes (Signed)
[facility] is sleepy but able to answer questions. Pt denies pain ATT.

## 2014-12-08 NOTE — ED Notes (Signed)
Patient to ED via EMS from home with report of fall out of bed around 2:30am, at that time EMS was called out but patient refused transport. Son arrived to home this morning and patient was c/o pain to left hip where she recently had surgery in May and EMS was called again.

## 2014-12-08 NOTE — ED Notes (Signed)
Pt discharged home after verbalizing understanding of discharge instructions; nad noted. 

## 2015-01-01 DIAGNOSIS — E7800 Pure hypercholesterolemia, unspecified: Secondary | ICD-10-CM | POA: Insufficient documentation

## 2015-01-01 DIAGNOSIS — Z79890 Hormone replacement therapy (postmenopausal): Secondary | ICD-10-CM | POA: Insufficient documentation

## 2015-01-01 DIAGNOSIS — M419 Scoliosis, unspecified: Secondary | ICD-10-CM | POA: Insufficient documentation

## 2015-01-01 DIAGNOSIS — G8929 Other chronic pain: Secondary | ICD-10-CM | POA: Insufficient documentation

## 2015-01-01 DIAGNOSIS — K219 Gastro-esophageal reflux disease without esophagitis: Secondary | ICD-10-CM | POA: Insufficient documentation

## 2015-01-01 DIAGNOSIS — F32A Depression, unspecified: Secondary | ICD-10-CM | POA: Insufficient documentation

## 2015-01-01 DIAGNOSIS — F09 Unspecified mental disorder due to known physiological condition: Secondary | ICD-10-CM | POA: Insufficient documentation

## 2015-01-01 DIAGNOSIS — H409 Unspecified glaucoma: Secondary | ICD-10-CM | POA: Insufficient documentation

## 2015-01-01 DIAGNOSIS — M8580 Other specified disorders of bone density and structure, unspecified site: Secondary | ICD-10-CM | POA: Insufficient documentation

## 2015-01-01 DIAGNOSIS — M549 Dorsalgia, unspecified: Secondary | ICD-10-CM

## 2015-01-01 DIAGNOSIS — R4189 Other symptoms and signs involving cognitive functions and awareness: Secondary | ICD-10-CM | POA: Insufficient documentation

## 2015-01-01 DIAGNOSIS — H3530 Unspecified macular degeneration: Secondary | ICD-10-CM | POA: Insufficient documentation

## 2015-01-01 DIAGNOSIS — IMO0002 Reserved for concepts with insufficient information to code with codable children: Secondary | ICD-10-CM | POA: Insufficient documentation

## 2015-01-01 DIAGNOSIS — K589 Irritable bowel syndrome without diarrhea: Secondary | ICD-10-CM | POA: Insufficient documentation

## 2015-01-01 DIAGNOSIS — F329 Major depressive disorder, single episode, unspecified: Secondary | ICD-10-CM | POA: Insufficient documentation

## 2015-01-01 DIAGNOSIS — M1990 Unspecified osteoarthritis, unspecified site: Secondary | ICD-10-CM | POA: Insufficient documentation

## 2015-01-17 ENCOUNTER — Ambulatory Visit: Payer: Self-pay | Admitting: Family Medicine

## 2015-01-25 ENCOUNTER — Ambulatory Visit (INDEPENDENT_AMBULATORY_CARE_PROVIDER_SITE_OTHER): Payer: Commercial Managed Care - HMO | Admitting: Family Medicine

## 2015-01-25 ENCOUNTER — Encounter: Payer: Self-pay | Admitting: Family Medicine

## 2015-01-25 VITALS — BP 126/62 | HR 100 | Temp 98.5°F | Resp 14

## 2015-01-25 DIAGNOSIS — E7800 Pure hypercholesterolemia, unspecified: Secondary | ICD-10-CM

## 2015-01-25 DIAGNOSIS — F09 Unspecified mental disorder due to known physiological condition: Secondary | ICD-10-CM

## 2015-01-25 DIAGNOSIS — F329 Major depressive disorder, single episode, unspecified: Secondary | ICD-10-CM | POA: Diagnosis not present

## 2015-01-25 DIAGNOSIS — S72002D Fracture of unspecified part of neck of left femur, subsequent encounter for closed fracture with routine healing: Secondary | ICD-10-CM

## 2015-01-25 DIAGNOSIS — H3530 Unspecified macular degeneration: Secondary | ICD-10-CM | POA: Diagnosis not present

## 2015-01-25 DIAGNOSIS — F32A Depression, unspecified: Secondary | ICD-10-CM

## 2015-01-25 MED ORDER — DONEPEZIL HCL 5 MG PO TABS: 5.0000 mg | ORAL_TABLET | Freq: Every day | ORAL | Status: DC

## 2015-01-25 NOTE — Progress Notes (Signed)
Patient ID: Lynn [name], female   DOB: [birth year], 81 y.o.   MRN: [id number]    Subjective:  HPI  Depression: Patient complains of depression. She complains of none. Patient states mostly feels ok. SOmetimes there is some anxiety noted towards evening time per son.. Onset was approximately a few months ago, controlled since that time.  She denies current suicidal and homicidal plan or intent.   Family history significant for no psychiatric illness.Possible organic causes contributing are: none.  Risk factors: none Previous treatment includes Zoloft and none. She complains of the following side effects from the treatment: none.  Also patient and her son today states occasionally she has hard time sleeping and has been taking Tylenol 2 twice daily at times-wanted tot see if it would be safe to take Tylenol PM instead.  Memory impairment Patient's son is concerned with this. MMSE in December 2015 was 30/30. Patient is not driving currently due to healing from the hip fracture this past winter. Patient states she does not feel like she gets confused in the evening that she knows of. Patient's son states from caregivers noted patient does get confused some in the evening. Patient cares for her second husband who is also in failing physical health. The son is hesitant to get involved in any confrontation regarding his mother's mental status.  Prior to Admission medications   Medication Sig Start Date End Date Taking? Authorizing Provider  acetaminophen (TYLENOL) 500 MG tablet Take 500 mg by mouth every 6 (six) hours as needed for moderate pain.    Yes Historical Provider, MD  brinzolamide (AZOPT) 1 % ophthalmic suspension Place 1 drop into both eyes 3 (three) times daily.   Yes Historical Provider, MD  Multiple Vitamins-Minerals (PRESERVISION AREDS 2) CAPS Take by mouth. 11/11/12  Yes Historical Provider, MD  sertraline (ZOLOFT) 50 MG tablet Take 50 mg by mouth daily.   Yes Historical Provider, MD    Calcium Carbonate-Vitamin D (CALCIUM 600/VITAMIN D) 600-400 MG-UNIT per chew tablet Chew by mouth. 05/11/10   Historical Provider, MD  Cholecalciferol (VITAMIN D3) 1000 UNITS CAPS Take by mouth. 11/11/12   Historical Provider, MD  Coenzyme Q10 (CO Q10) 100 MG CAPS Take by mouth.    Historical Provider, MD  Cyanocobalamin 1000 MCG CAPS Take by mouth. 11/11/12   Historical Provider, MD  meclizine (ANTIVERT) 25 MG tablet Take 25 mg by mouth every 8 (eight) hours as needed for dizziness.    Historical Provider, MD  omeprazole (PRILOSEC) 20 MG capsule Take 20 mg by mouth daily.    Historical Provider, MD  oxyCODONE (OXY IR/ROXICODONE) 5 MG immediate release tablet Take 5-10 mg by mouth every 4 (four) hours as needed for severe pain.    Historical Provider, MD    Patient Active Problem List   Diagnosis Date Noted  . Cognitive disorder 01/01/2015  . Arthritis 01/01/2015  . Back pain, chronic 01/01/2015  . Bladder cystocele 01/01/2015  . Clinical depression 01/01/2015  . Acid reflux 01/01/2015  . Glaucoma 01/01/2015  . Hypercholesteremia 01/01/2015  . 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 Degeneration macular 01/01/2015  . Osteopenia 01/01/2015  . Need for prophylactic hormone replacement therapy (postmenopausal) 01/01/2015  . 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 Scoliosis 01/01/2015  . Closed fracture of neck of left femur with routine healing 11/20/2014    No past medical history on file.  History   Social History  . Marital Status: Married    Spouse Name: Aeronautical engineer  . Number of Children: 3  .  Years of Education: 25   Occupational History  . retired    Social History Main Topics  . Smoking status: Never Smoker   . Smokeless tobacco: Never Used  . Alcohol Use: No  . Drug Use: No  . Sexual Activity: No   Other Topics Concern  . Not on file   Social History Narrative    Allergies  Allergen Reactions  . Sulfa Antibiotics Other (See Comments)    Reaction: Pt does not remember.     Review of Systems  Constitutional: Negative for fever, chills and malaise/fatigue.  Respiratory: Negative for cough, hemoptysis, shortness of breath and wheezing.   Cardiovascular: Negative for chest pain, palpitations, claudication and leg swelling.  Gastrointestinal: Negative for heartburn, nausea, vomiting, abdominal pain and diarrhea.  Musculoskeletal: Positive for joint pain. Negative for myalgias, falls and neck pain.  Neurological: Positive for dizziness (sometimes in the morning when she wakes up to sit up). Negative for tremors, weakness and headaches.  Psychiatric/Behavioral: Negative for depression (stable) and suicidal ideas. The patient is nervous/anxious and has insomnia.     Immunization History  Administered Date(s) Administered  . Td 04/11/2006   Objective:  BP 126/62 mmHg  Pulse 100  Temp(Src) 98.5 F (36.9 C)  Resp 14  Wt   Physical Exam  Constitutional: She is oriented to person, place, and time and well-developed, well-nourished, and in no distress.  HENT:  Head: Normocephalic and atraumatic.  Right Ear: External ear normal.  Left Ear: External ear normal.  Nose: Nose normal.  Eyes: Conjunctivae are normal. Pupils are equal, round, and reactive to light.  Neck: Normal range of motion. Neck supple.  Cardiovascular: Normal rate, regular rhythm and intact distal pulses.   Murmur heard.  Systolic murmur is present with a grade of 2/6  Pulmonary/Chest: Effort normal and breath sounds normal. No respiratory distress. She has no wheezes. She exhibits no tenderness.  Abdominal: Soft.  Musculoskeletal: She exhibits no edema or tenderness.       Left hip: She exhibits decreased range of motion (healing from hip fracture.).  Moderate/severe increase in thoracic kyphosis  Neurological: She is alert and oriented to person, place, and time.  Skin: Skin is warm and dry.  Psychiatric: Mood normal.    Lab Results  Component Value Date   WBC 8.7 10/05/2014    HGB 11.6* 10/05/2014   HCT 34.2* 10/05/2014   PLT 300 10/05/2014   GLUCOSE 114* 10/05/2014   CHOL 181 07/18/2011   TRIG 75 07/18/2011   HDL 76* 07/18/2011   LDLCALC 90 07/18/2011   TSH 0.87 01/27/2014   INR 1.1 10/01/2014    CMP     Component Value Date/Time   NA 137 10/05/2014 0503   NA 144 01/27/2014   K 3.6 10/05/2014 0503   K 4.3 01/27/2014   CL 106 10/05/2014 0503   CO2 25 10/05/2014 0503   GLUCOSE 114* 10/05/2014 0503   BUN 8 10/05/2014 0503   BUN 27* 01/27/2014   CREATININE 0.55 10/05/2014 0503   CREATININE 0.9 01/27/2014   CALCIUM 8.5* 10/05/2014 0503   PROT 6.7 10/01/2014 2200   ALBUMIN 3.6 10/01/2014 2200   AST 25 10/01/2014 2200   AST 20 01/27/2014   ALT 19 10/01/2014 2200   ALT 19 01/27/2014   ALKPHOS 76 10/01/2014 2200   ALKPHOS 68 01/27/2014   BILITOT 0.9 10/01/2014 2200   GFRNONAA >60 10/05/2014 0503   GFRAA >60 10/05/2014 0503    Assessment and Plan :  1.  Clinical depression/anxiety Stable.  2. Closed fracture of neck of left femur with routine healing Still recovering and following Dr. Ernest Pine. I am not sure she will ever heal enough to be able to walk again.  3. Cognitive disorder/early Alzheimer's disease most likely Patient does not feel like this is a problem but son is concerned (especially at this time in the evening).  I have noticed some MCI during OV from the way patient was before. Will add Donepezil and re check on the next visit in about 2 months. Patient is unable to do any weightbearing on her surgically repaired hip so I am not worried about her driving at this point in time. She and her husband have round-the-clock care in their home. She is very charming. I think what is happening now she is having a lot of sundown at nighttime issues. The people around her do not want to confront her about this. We'll have to watch closely and follow. May need to need neurology referral to confirm or to help deal with this issue. 4. Degeneration  macular Stable-following opthalmologist. 5. Cachexia/mild failure to thrive More than 25 minutes spent total on office visit. Most spent in counseling.     Patient was seen and examined by Dr. Bosie Clos and note was scribed by Samara Deist, RMA.   Julieanne Manson MD [facility] 01/25/2015 4:03 PM

## 2015-02-04 ENCOUNTER — Ambulatory Visit (INDEPENDENT_AMBULATORY_CARE_PROVIDER_SITE_OTHER): Payer: Commercial Managed Care - HMO | Admitting: Physician Assistant

## 2015-02-04 ENCOUNTER — Encounter: Payer: Self-pay | Admitting: Physician Assistant

## 2015-02-04 VITALS — BP 108/60 | HR 72 | Temp 98.8°F | Resp 16

## 2015-02-04 DIAGNOSIS — E860 Dehydration: Secondary | ICD-10-CM | POA: Diagnosis not present

## 2015-02-04 NOTE — Progress Notes (Signed)
Patient ID: Lynn [name], female   DOB: 03-29-1927, 81 y.o.   MRN: [id number]       Patient: Lynn [name] Female    DOB: 12/16/1926   81 y.o.   MRN: [id number] Visit Date: 02/04/2015  Today's Provider: Margaretann Loveless, PA-C   Chief Complaint  Patient presents with  . Altered Mental Status   Subjective:    Altered Mental Status The current episode started in the past 7 days. The problem occurs intermittently.  Per her son he feels that since starting Aricept she has had some mild increased confusion. He also mentions that she was having  increased sensations of having to have a bowel movements and also urination. She reports that when she goes to have a bowel movement they have been hard and very small. She also reports that she does feel a she has to urinate but when she tries to go to she only dribbles just a small amount. She denies any abdominal pain, abdominal bloating, fever, chills, flank pain, nausea, or vomiting.       Allergies  Allergen Reactions  . Sulfa Antibiotics Other (See Comments)    Reaction: Pt does not remember.   Previous Medications   ACETAMINOPHEN (TYLENOL) 500 MG TABLET    Take 500 mg by mouth every 6 (six) hours as needed for moderate pain.    BRINZOLAMIDE (AZOPT) 1 % OPHTHALMIC SUSPENSION    Place 1 drop into both eyes 3 (three) times daily.   CALCIUM CARBONATE-VITAMIN D (CALCIUM 600/VITAMIN D) 600-400 MG-UNIT PER CHEW TABLET    Chew by mouth.   CHOLECALCIFEROL (VITAMIN D3) 1000 UNITS CAPS    Take by mouth.   COENZYME Q10 (CO Q10) 100 MG CAPS    Take by mouth.   CYANOCOBALAMIN 1000 MCG CAPS    Take by mouth.   DONEPEZIL (ARICEPT) 5 MG TABLET    Take 1 tablet (5 mg total) by mouth at bedtime.   MECLIZINE (ANTIVERT) 25 MG TABLET    Take 25 mg by mouth every 8 (eight) hours as needed for dizziness.   MULTIPLE VITAMINS-MINERALS (PRESERVISION AREDS 2) CAPS    Take by mouth.   OMEPRAZOLE (PRILOSEC) 20 MG CAPSULE    Take 20 mg by mouth daily.   OXYCODONE  (OXY IR/ROXICODONE) 5 MG IMMEDIATE RELEASE TABLET    Take 5-10 mg by mouth every 4 (four) hours as needed for severe pain.   SERTRALINE (ZOLOFT) 50 MG TABLET    Take 50 mg by mouth daily.    Review of Systems  History  Substance Use Topics  . Smoking status: Never Smoker   . Smokeless tobacco: Never Used  . Alcohol Use: No   Objective:   BP 108/60 mmHg  Pulse 72  Temp(Src) 98.8 F (37.1 C)  Resp 16  Wt   Physical Exam  Constitutional: She appears well-developed and well-nourished. No distress.  Abdominal: Soft. Bowel sounds are normal. She exhibits no distension and no mass. There is no tenderness. There is no rebound, no guarding and no CVA tenderness.  Neurological: She is alert.  Oriented x 2.  She did repeat herself a few times during the interview.  Skin: Skin is warm and dry. She is not diaphoretic.  Psychiatric: She has a normal mood and affect. Her behavior is normal. Judgment and thought content normal.   she was fidgeting a lot in her wheelchair. When asked about this she said that she had fallen and broken her hip in March  2016 and that she had trouble sitting comfortably since her hip fracture and surgeries.      Assessment & Plan:     1. Dehydration I discussed with the son that since she is having both urinary and bowel symptoms that I felt this was most likely secondary to dehydration from starting the Aricept. I did recommend to increase fluid intake and to start Metamucil and a stool softener for bowel regularity. I also did warn of any worsening symptoms such as fevers, chills, nausea, vomiting, worsening abdominal pain, flank pain, blood in the urine or stool, or increased confusion that he is to go to the hospital. They both voiced understanding and agreed. I did advise him to call the office if her symptoms worsen or do not improve in the next few days. If symptoms do not improve it may be best to discontinue the Aricept. He voiced understanding. May follow-up  as needed.       Margaretann Loveless, PA-C  [facility]

## 2015-02-04 NOTE — Patient Instructions (Signed)
Dehydration Dehydration is when you lose more fluids from the body than you take in. Vital organs such as the kidneys, brain, and heart cannot function without a proper amount of fluids and salt. Any loss of fluids from the body can cause dehydration.  Older adults are at a higher risk of dehydration than younger adults. As we age, our bodies are less able to conserve water and do not respond to temperature changes as well. Also, older adults do not become thirsty as easily or quickly. Because of this, older adults often do not realize they need to increase fluids to avoid dehydration.  CAUSES   Vomiting.  Diarrhea.  Excessive sweating.  Excessive urination.  Fever.  Certain medicines, such as blood pressure medicines called diuretics.  Poorly controlled blood sugars. SIGNS AND SYMPTOMS  Mild dehydration:  Thirst.  Dry lips.  Slightly dry mouth. Moderate dehydration:  Very dry mouth.  Sunken eyes.  Skin does not bounce back quickly when lightly pinched and released.  Dark urine and decreased urine production.  Decreased tear production.  Headache. Severe dehydration:  Very dry mouth.  Extreme thirst.  Rapid, weak pulse (more than 100 beats per minute at rest).  Cold hands and feet.  Not able to sweat in spite of heat.  Rapid breathing.  Blue lips.  Confusion and lethargy.  Difficulty being awakened.  Minimal urine production.  No tears. DIAGNOSIS  Your health care provider will diagnose dehydration based on your symptoms and your exam. Blood and urine tests will help confirm the diagnosis. The diagnostic evaluation should also identify the cause of dehydration. TREATMENT  Treatment of mild or moderate dehydration can often be done at home by increasing the amount of fluids that you drink. It is best to drink small amounts of fluid more often. Drinking too much at one time can make vomiting worse. Severe dehydration needs to be treated at the hospital.  You may be given IV fluids that contain water and electrolytes. HOME CARE INSTRUCTIONS   Ask your health care provider about specific rehydration instructions.  Drink enough fluids to keep your urine clear or pale yellow.  Drink small amounts frequently if you have nausea and vomiting.  Eat as you normally do.  Avoid:  Foods or drinks high in sugar.  Carbonated drinks.  Juice.  Extremely hot or cold fluids.  Drinks with caffeine.  Fatty, greasy foods.  Alcohol.  Tobacco.  Overeating.  Gelatin desserts.  Wash your hands well to avoid spreading bacteria and viruses.  Only take over-the-counter or prescription medicines for pain, discomfort, or fever as directed by your health care provider.  Ask your health care provider if you should continue all prescribed and over-the-counter medicines.  Keep all follow-up appointments with your health care provider. SEEK MEDICAL CARE IF:  You have abdominal pain, and it increases or stays in one area (localizes).  You have a rash, stiff neck, or severe headache.  You are irritable, sleepy, or difficult to awaken.  You are weak, dizzy, or extremely thirsty.  You have a fever. SEEK IMMEDIATE MEDICAL CARE IF:   You are unable to keep fluids down, or you get worse despite treatment.  You have frequent episodes of vomiting or diarrhea.  You have blood or green matter (bile) in your vomit.  You have blood in your stool, or your stool looks black and tarry.  You have not urinated in 6-8 hours, or you have only urinated a small amount of very dark urine.    You faint. MAKE SURE YOU:   Understand these instructions.  Will watch your condition.  Will get help right away if you are not doing well or get worse. Document Released: 09/15/2003 Document Revised: 06/30/2013 Document Reviewed: 03/02/2013 Exit[employer] Patient Information 2015 [employer]. This information is not intended to replace advice given to you by your  health care provider. Make sure you discuss any questions you have with your health care provider.  

## 2015-02-21 ENCOUNTER — Other Ambulatory Visit: Payer: Self-pay | Admitting: Family Medicine

## 2015-02-23 ENCOUNTER — Telehealth: Payer: Self-pay | Admitting: Family Medicine

## 2015-02-23 NOTE — Telephone Encounter (Signed)
Please review. Thanks!  

## 2015-02-23 NOTE — Telephone Encounter (Signed)
Aslyn with [facility] called to request a verbal order for physical therapy, 2 times a week for 4 weeks.  YQ#[phone]

## 2015-02-23 NOTE — Telephone Encounter (Signed)
Order placed on your desk to sign. Thanks! 

## 2015-02-23 NOTE — Telephone Encounter (Signed)
[facility] stated that pt's son Lynn [name] would like pt's Oxycodone 5 mg to be stopped and orders for tylenol 500 mg, 3 times a day sent to [facility]. Fax# [phone]. Thanks TNP

## 2015-02-24 NOTE — Telephone Encounter (Signed)
ok 

## 2015-02-24 NOTE — Telephone Encounter (Signed)
Lynn [name] advised-aa

## 2015-02-24 NOTE — Telephone Encounter (Signed)
Done-aa 

## 2015-02-24 NOTE — Telephone Encounter (Signed)
Papers done/signed.

## 2015-03-03 DIAGNOSIS — R262 Difficulty in walking, not elsewhere classified: Secondary | ICD-10-CM | POA: Diagnosis not present

## 2015-03-03 DIAGNOSIS — S72002S Fracture of unspecified part of neck of left femur, sequela: Secondary | ICD-10-CM | POA: Diagnosis not present

## 2015-03-03 DIAGNOSIS — G8929 Other chronic pain: Secondary | ICD-10-CM | POA: Diagnosis not present

## 2015-03-04 ENCOUNTER — Telehealth: Payer: Self-pay | Admitting: Family Medicine

## 2015-03-04 NOTE — Telephone Encounter (Signed)
See below Dr.Gilberts patient=-aa 

## 2015-03-04 NOTE — Telephone Encounter (Signed)
OK to order occupation therapy as requested. Thanks.

## 2015-03-04 NOTE — Telephone Encounter (Signed)
Lynn [name] with [facility]  is requesting occupational therapy twice a week for 4 weeks. The orders can be verbal. Thanks TNP

## 2015-03-04 NOTE — Telephone Encounter (Signed)
Advised Stephanie as below.  

## 2015-03-15 ENCOUNTER — Telehealth: Payer: Self-pay | Admitting: Family Medicine

## 2015-03-15 NOTE — Telephone Encounter (Signed)
[facility] called needing clarification on the metamucil.   They said they needed to know how much.  The order says pills but she takes the powder,   Please advise.  Callback is   [phone].  Thanks, [name]

## 2015-03-15 NOTE — Telephone Encounter (Signed)
See below, there was a fax also that was put on your desk this morning. Please review-aa

## 2015-04-21 ENCOUNTER — Telehealth: Payer: Self-pay | Admitting: Family Medicine

## 2015-05-25 ENCOUNTER — Other Ambulatory Visit: Payer: Self-pay | Admitting: Family Medicine

## 2015-05-30 ENCOUNTER — Other Ambulatory Visit: Payer: Self-pay | Admitting: Family Medicine

## 2015-06-14 ENCOUNTER — Other Ambulatory Visit: Payer: Self-pay | Admitting: Family Medicine

## 2015-06-20 ENCOUNTER — Other Ambulatory Visit: Payer: Self-pay | Admitting: Family Medicine

## 2015-06-23 ENCOUNTER — Other Ambulatory Visit: Payer: Self-pay

## 2015-06-23 MED ORDER — CO Q10 100 MG PO CAPS: 1.0000 | ORAL_CAPSULE | Freq: Every day | ORAL | Status: DC

## 2015-06-25 ENCOUNTER — Other Ambulatory Visit: Payer: Self-pay | Admitting: Family Medicine

## 2015-06-27 ENCOUNTER — Other Ambulatory Visit: Payer: Self-pay | Admitting: Family Medicine

## 2015-07-22 ENCOUNTER — Telehealth: Payer: Self-pay | Admitting: Family Medicine

## 2015-07-22 NOTE — Telephone Encounter (Signed)
Kat advised and order sent over-aa

## 2015-07-22 NOTE — Telephone Encounter (Signed)
Kat with [facility] would like written orders for pt to get 1000 mg of Tylenol 3 times daily. Pt currently has orders for twice a day but the family has expressed to the staff that the pt has been complaining of pain to the family and are asking for the orders to be increased. Georgiann HahnKat also request a nurse return her call to discuss the orders to see what needs to continue and what needs to be discontinued. Please advise. Thanks TNP

## 2015-07-22 NOTE — Telephone Encounter (Signed)
Dr Gilbert's patient. Please review-aa 

## 2015-07-22 NOTE — Telephone Encounter (Signed)
OK to increase acetaminophen  To 1000mg  three times a day

## 2015-08-04 ENCOUNTER — Other Ambulatory Visit: Payer: Self-pay | Admitting: Family Medicine

## 2015-08-04 DIAGNOSIS — G8929 Other chronic pain: Secondary | ICD-10-CM

## 2015-08-04 DIAGNOSIS — M549 Dorsalgia, unspecified: Principal | ICD-10-CM

## 2015-08-04 MED ORDER — HYDROCODONE-ACETAMINOPHEN 5-325 MG PO TABS: 1.0000 | ORAL_TABLET | ORAL | Status: DC | PRN

## 2015-08-04 NOTE — Telephone Encounter (Signed)
Called sandy (patient's daughter in law) back to let her know that Rx was ready. Unable to leave Vm due to mailbox being full. Will try again later.

## 2015-08-04 NOTE — Telephone Encounter (Signed)
Pt contacted office for refill request on the following medications:  oxyCODONE (OXY IR/ROXICODONE) 5 MG immediate release tablet.  CB#[phone]/MW   Pt is at [facility] and having a lot of hip pain.  Pt is taking Tylenol 3 times a day but still having a lot of pain/MW

## 2015-08-04 NOTE — Addendum Note (Signed)
Addended by: Verdis Prime on: 08/04/2015 03:18 PM   Modules accepted: Orders

## 2015-08-04 NOTE — Telephone Encounter (Signed)
Does not go from Tylenol straight oxycodone. List try hydrocodone 5/325, 1-2 every 4 hours when necessary #100

## 2015-08-17 ENCOUNTER — Telehealth: Payer: Self-pay

## 2015-08-17 DIAGNOSIS — F32A Depression, unspecified: Secondary | ICD-10-CM

## 2015-08-17 DIAGNOSIS — F329 Major depressive disorder, single episode, unspecified: Secondary | ICD-10-CM

## 2015-08-17 DIAGNOSIS — E7800 Pure hypercholesterolemia, unspecified: Secondary | ICD-10-CM

## 2015-08-17 NOTE — Telephone Encounter (Signed)
Patient did not get labs done that were ordered in July and son came by today and wanted to get a lab slip to have this done on Friday-aa

## 2015-08-19 ENCOUNTER — Other Ambulatory Visit: Payer: Self-pay | Admitting: Family Medicine

## 2015-08-29 ENCOUNTER — Telehealth: Payer: Self-pay | Admitting: Family Medicine

## 2015-08-29 NOTE — Telephone Encounter (Signed)
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 Please call Mark @ [phone]    KB

## 2015-08-30 ENCOUNTER — Telehealth: Payer: Self-pay | Admitting: Family Medicine

## 2015-08-30 NOTE — Telephone Encounter (Signed)
Please review-aa 

## 2015-08-30 NOTE — Telephone Encounter (Signed)
Dennis advised-aa 

## 2015-08-30 NOTE — Telephone Encounter (Signed)
Dennis with [facility] called to request a verbal order for urinalysis and culture related to confusion and strange behavior.  CB#[phone]/MW

## 2015-08-30 NOTE — Telephone Encounter (Signed)
Loraine Leriche advised the only thing i see in epic and old system is that Sertraline was prescribed and advised mark it was for anxiety/depression.-aa

## 2015-08-30 NOTE — Telephone Encounter (Signed)
ok 

## 2015-09-13 ENCOUNTER — Telehealth: Payer: Self-pay | Admitting: Family Medicine

## 2015-09-13 NOTE — Telephone Encounter (Signed)
Please review-aa 

## 2015-09-13 NOTE — Telephone Encounter (Signed)
[facility] and pt nurse, Maurine MinisterDennis is requesting a day time medication to help pt with anxiety.  Son states a night time medication was rec'd but pt also needs something for help with anxiety during the day.  This will need to be sent to [facility].  ZO#[phone]/WJCB#[phone]/MW

## 2015-09-14 NOTE — Telephone Encounter (Signed)
They will need to discuss this with Dr. Sullivan LoneGilbert.  Sertraline is a 24hour acting medication, it is not just for nighttime anxiety. They should probably schedule an appointment to see Dr. Sullivan LoneGilbert since she has not been seen by an MD since last July.

## 2015-09-14 NOTE — Telephone Encounter (Signed)
Patient is currently taking sertraline 50 mg qd, for depression. Please advise?

## 2015-09-14 NOTE — Telephone Encounter (Signed)
Please look into further im on the phone to see what medication they mean, we dont have the recent fax scanned in yet, thank you. Dr Sullivan Lonegilbert not here the rest of the week-aa

## 2015-09-15 NOTE — Telephone Encounter (Signed)
Patient's son [name] advised as directed below. [name] states he will call back to schedule a follow up appointment. Patient had labs done today also.

## 2015-09-16 ENCOUNTER — Telehealth: Payer: Self-pay | Admitting: Family Medicine

## 2015-09-16 LAB — CBC WITH DIFFERENTIAL/PLATELET
BASOS ABS: 0.1 10*3/uL (ref 0.0–0.2)
BASOS: 1 %
EOS (ABSOLUTE): 0.1 10*3/uL (ref 0.0–0.4)
EOS: 2 %
HEMATOCRIT: 40.4 % (ref 34.0–46.6)
HEMOGLOBIN: 13.2 g/dL (ref 11.1–15.9)
IMMATURE GRANS (ABS): 0 10*3/uL (ref 0.0–0.1)
Immature Granulocytes: 0 %
LYMPHS ABS: 2.7 10*3/uL (ref 0.7–3.1)
LYMPHS: 43 %
MCH: 30.4 pg (ref 26.6–33.0)
MCHC: 32.7 g/dL (ref 31.5–35.7)
MCV: 93 fL (ref 79–97)
MONOCYTES: 10 %
Monocytes Absolute: 0.6 10*3/uL (ref 0.1–0.9)
Neutrophils Absolute: 2.7 10*3/uL (ref 1.4–7.0)
Neutrophils: 44 %
Platelets: 289 10*3/uL (ref 150–379)
RBC: 4.34 x10E6/uL (ref 3.77–5.28)
RDW: 14.5 % (ref 12.3–15.4)
WBC: 6.1 10*3/uL (ref 3.4–10.8)

## 2015-09-16 LAB — COMPREHENSIVE METABOLIC PANEL
ALBUMIN: 3.9 g/dL (ref 3.5–4.7)
ALT: 11 IU/L (ref 0–32)
AST: 15 IU/L (ref 0–40)
Albumin/Globulin Ratio: 1.4 (ref 1.1–2.5)
Alkaline Phosphatase: 98 IU/L (ref 39–117)
BILIRUBIN TOTAL: 0.5 mg/dL (ref 0.0–1.2)
BUN / CREAT RATIO: 33 — AB (ref 11–26)
BUN: 18 mg/dL (ref 8–27)
CHLORIDE: 104 mmol/L (ref 96–106)
CO2: 25 mmol/L (ref 18–29)
Calcium: 9.5 mg/dL (ref 8.7–10.3)
Creatinine, Ser: 0.55 mg/dL — ABNORMAL LOW (ref 0.57–1.00)
GFR calc Af Amer: 98 mL/min/{1.73_m2} (ref >59)
GFR calc non Af Amer: 85 mL/min/{1.73_m2} (ref >59)
GLOBULIN, TOTAL: 2.8 g/dL (ref 1.5–4.5)
GLUCOSE: 106 mg/dL — AB (ref 65–99)
Potassium: 4.2 mmol/L (ref 3.5–5.2)
SODIUM: 142 mmol/L (ref 134–144)
TOTAL PROTEIN: 6.7 g/dL (ref 6.0–8.5)

## 2015-09-16 LAB — LIPID PANEL WITH LDL/HDL RATIO
Cholesterol, Total: 179 mg/dL (ref 100–199)
HDL: 59 mg/dL (ref >39)
LDL Calculated: 103 mg/dL — ABNORMAL HIGH (ref 0–99)
LDl/HDL Ratio: 1.7 ratio units (ref 0.0–3.2)
Triglycerides: 85 mg/dL (ref 0–149)
VLDL Cholesterol Cal: 17 mg/dL (ref 5–40)

## 2015-09-16 LAB — TSH: TSH: 0.853 u[IU]/mL (ref 0.450–4.500)

## 2015-09-16 NOTE — Telephone Encounter (Signed)
Talked with Maurine Ministerennis. Son wants to wait. Thanks.

## 2015-09-16 NOTE — Telephone Encounter (Signed)
Arlina Robesennis Shelton with [facility] would like to get an order for pt's increased anxiety. We received a fax on 09/12/15 from [facility] about pt's anxiety and it being disruptive to other pt's and Dr. Sullivan LoneGilbert faxed it back ordering Mirtazapine 30 mg at bedtime. Maurine MinisterDennis stated this has helped at night but they would like pt to have something during the day as well. Pt's son also called about this and was advised by Dr. Sherrie MustacheFisher that pt should she Dr. Sullivan LoneGilbert. Maurine MinisterDennis would like another provider to look over this since Dr. Sullivan LoneGilbert is out of the office until Monday 09/19/15 and Maurine MinisterDennis will get in touch with family to see about scheduling f/u with Dr. Sullivan LoneGilbert. Please advise. Thanks TNP

## 2015-10-07 ENCOUNTER — Other Ambulatory Visit: Payer: Self-pay | Admitting: Family Medicine

## 2015-10-11 ENCOUNTER — Encounter: Payer: Self-pay | Admitting: Family Medicine

## 2015-12-12 ENCOUNTER — Other Ambulatory Visit: Payer: Self-pay | Admitting: Family Medicine

## 2015-12-23 ENCOUNTER — Other Ambulatory Visit: Payer: Self-pay | Admitting: Family Medicine

## 2016-01-11 ENCOUNTER — Other Ambulatory Visit: Payer: Self-pay | Admitting: Family Medicine

## 2016-01-14 ENCOUNTER — Other Ambulatory Visit: Payer: Self-pay | Admitting: Family Medicine

## 2016-02-08 ENCOUNTER — Ambulatory Visit (INDEPENDENT_AMBULATORY_CARE_PROVIDER_SITE_OTHER): Payer: Commercial Managed Care - HMO | Admitting: Family Medicine

## 2016-02-08 ENCOUNTER — Encounter: Payer: Self-pay | Admitting: Family Medicine

## 2016-02-08 VITALS — BP 124/72 | HR 78 | Temp 98.6°F | Resp 16 | Wt 111.0 lb

## 2016-02-08 DIAGNOSIS — K219 Gastro-esophageal reflux disease without esophagitis: Secondary | ICD-10-CM

## 2016-02-08 DIAGNOSIS — E46 Unspecified protein-calorie malnutrition: Secondary | ICD-10-CM | POA: Diagnosis not present

## 2016-02-08 DIAGNOSIS — F0280 Dementia in other diseases classified elsewhere without behavioral disturbance: Secondary | ICD-10-CM | POA: Diagnosis not present

## 2016-02-08 DIAGNOSIS — M1990 Unspecified osteoarthritis, unspecified site: Secondary | ICD-10-CM

## 2016-02-08 DIAGNOSIS — E7800 Pure hypercholesterolemia, unspecified: Secondary | ICD-10-CM | POA: Diagnosis not present

## 2016-02-08 DIAGNOSIS — G309 Alzheimer's disease, unspecified: Secondary | ICD-10-CM | POA: Diagnosis not present

## 2016-02-08 DIAGNOSIS — F329 Major depressive disorder, single episode, unspecified: Secondary | ICD-10-CM

## 2016-02-08 DIAGNOSIS — F09 Unspecified mental disorder due to known physiological condition: Secondary | ICD-10-CM | POA: Diagnosis not present

## 2016-02-08 DIAGNOSIS — H9191 Unspecified hearing loss, right ear: Secondary | ICD-10-CM

## 2016-02-08 DIAGNOSIS — M6289 Other specified disorders of muscle: Secondary | ICD-10-CM | POA: Diagnosis not present

## 2016-02-08 DIAGNOSIS — R29898 Other symptoms and signs involving the musculoskeletal system: Secondary | ICD-10-CM

## 2016-02-08 DIAGNOSIS — F32A Depression, unspecified: Secondary | ICD-10-CM

## 2016-02-08 NOTE — Progress Notes (Signed)
Subjective:  HPI  Patient is here for follow up. Last office visit was 1 year ago. She lives at [facility]. Patient feels like she is doing well besides 2 issues she wanted to address: 1) Right hand pain at times, decreased grip strength and assymetry to the right hand compared to the left for about 2 months.  2) Decreased hearing right ear for a while. No ringing sensation.  Prior to Admission medications   Medication Sig Start Date End Date Taking? Authorizing Provider  brinzolamide (AZOPT) 1 % ophthalmic suspension Place 1 drop into both eyes 3 (three) times daily.   Yes Historical Provider, MD  Calcium Carbonate-Vitamin D 600-400 MG-UNIT tablet TAKE 1 TABLET BY MOUTH ONCE DAILY FOR SUPPLEMENT 08/22/15  Yes Diamante Truszkowski Hulen Shouts., MD  Cholecalciferol (VITAMIN D3) 1000 UNITS CAPS Take by mouth. 11/11/12  Yes Historical Provider, MD  Coenzyme Q10 (CO Q10) 100 MG CAPS Take 1 capsule by mouth daily. 06/23/15  Yes Keane Martelli Hulen Shouts., MD  donepezil (ARICEPT) 5 MG tablet TAKE 1 TABLET BY MOUTH ONCE DAILY FOR DEMENTIA 05/30/15  Yes Maple Hudson., MD  [facility] NATURAL FIBER 0.52 g capsule TAKE 2 CAPSULES BY MOUTH EACH MORNING FOR CONSTIPATION 01/16/16  Yes Mardell Cragg Hulen Shouts., MD  MAPAP 500 MG tablet TAKE 2 TABLETS (1000 MG) BY MOUTH 3 TIMES DAILY FOR PAIN OR DISCOMFORT 12/16/15  Yes Maple Hudson., MD  meclizine (ANTIVERT) 25 MG tablet Take 25 mg by mouth every 8 (eight) hours as needed for dizziness.   Yes Historical Provider, MD  mirtazapine (REMERON) 30 MG tablet TAKE 1 TABLET BY MOUTH ONCE DAILY AT BEDTIME FOR SLEEP OR AGITATION 10/10/15  Yes Vivian Neuwirth Hulen Shouts., MD  Multiple Vitamin (TAB-A-VITE) TABS TAKE 1 TABLET BY MOUTH ONCE DAILY FOR SUPPLEMENT 05/25/15  Yes Mason Burleigh Hulen Shouts., MD  Multiple Vitamins-Minerals (PRESERVISION AREDS 2) CAPS Take by mouth. 11/11/12  Yes Historical Provider, MD  omeprazole (PRILOSEC) 20 MG capsule TAKE 1 CAPSULE BY MOUTH ONCE DAILY FOR REFLUX  06/20/15  Yes Jeniffer Culliver Hulen Shouts., MD  sertraline (ZOLOFT) 50 MG tablet TAKE 1 TABLET BY MOUTH ONCE DAILY FOR DEPRESSION 06/15/15  Yes Maple Hudson., MD    Patient Active Problem List   Diagnosis Date Noted  . Cognitive disorder 01/01/2015  . Arthritis 01/01/2015  . Back pain, chronic 01/01/2015  . Bladder cystocele 01/01/2015  . Clinical depression 01/01/2015  . Acid reflux 01/01/2015  . Glaucoma 01/01/2015  . Hypercholesteremia 01/01/2015  . 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 Degeneration macular 01/01/2015  . Osteopenia 01/01/2015  . Need for prophylactic hormone replacement therapy (postmenopausal) 01/01/2015  . 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 Scoliosis 01/01/2015  . Closed fracture of neck of left femur with routine healing 11/20/2014    No past medical history on file.  Social History   Social History  . Marital status: Married    Spouse name: Aeronautical engineer  . Number of children: 3  . Years of education: 25   Occupational History  . retired    Social History Main Topics  . Smoking status: Never Smoker  . Smokeless tobacco: Never Used  . Alcohol use No  . Drug use: No  . Sexual activity: No   Other Topics Concern  . Not on file   Social History Narrative  . No narrative on file    Allergies  Allergen Reactions  . Sulfa Antibiotics Other (See Comments)    Reaction:  Pt does not remember.    Review of Systems  Constitutional: Positive for malaise/fatigue.  HENT: Positive for hearing loss.   Respiratory: Negative.   Cardiovascular: Negative.   Musculoskeletal: Positive for joint pain and myalgias.  Psychiatric/Behavioral: Negative.     Immunization History  Administered Date(s) Administered  . Td 04/11/2006   Objective:  BP 124/72   Pulse 78   Temp 98.6 F (37 C)   Resp 16   Wt 111 lb (50.3 kg)   BMI 20.30 kg/m   Physical Exam  Constitutional: She is oriented to person, place, and time and well-developed, well-nourished, and in no  distress.  HENT:  Head: Normocephalic and atraumatic.  Right Ear: External ear normal.  Left Ear: External ear normal.  Mouth/Throat: Oropharynx is clear and moist.  More fluid behind right TM compared to the left TM  Eyes: Conjunctivae are normal. Pupils are equal, round, and reactive to light.  Neck: Normal range of motion. Neck supple.  Cardiovascular: Normal rate, regular rhythm, normal heart sounds and intact distal pulses.   No murmur heard. Pulmonary/Chest: Effort normal and breath sounds normal. No respiratory distress. She has no wheezes.  Musculoskeletal:       Right hand: She exhibits decreased range of motion. Decreased strength noted.  Negative Tinnels, wasting of the interosseous muscles worse on the right She ambulates in the wheelchair  Neurological: She is alert and oriented to person, place, and time.  Psychiatric: Mood, memory, affect and judgment normal.    Lab Results  Component Value Date   WBC 6.1 09/15/2015   HGB 11.6 (L) 10/05/2014   HCT 40.4 09/15/2015   PLT 289 09/15/2015   GLUCOSE 106 (H) 09/15/2015   CHOL 179 09/15/2015   TRIG 85 09/15/2015   HDL 59 09/15/2015   LDLCALC 103 (H) 09/15/2015   TSH 0.853 09/15/2015   INR 1.1 10/01/2014    CMP     Component Value Date/Time   NA 142 09/15/2015 0840   NA 137 10/05/2014 0503   K 4.2 09/15/2015 0840   K 3.6 10/05/2014 0503   CL 104 09/15/2015 0840   CL 106 10/05/2014 0503   CO2 25 09/15/2015 0840   CO2 25 10/05/2014 0503   GLUCOSE 106 (H) 09/15/2015 0840   GLUCOSE 114 (H) 10/05/2014 0503   BUN 18 09/15/2015 0840   BUN 8 10/05/2014 0503   CREATININE 0.55 (L) 09/15/2015 0840   CREATININE 0.55 10/05/2014 0503   CALCIUM 9.5 09/15/2015 0840   CALCIUM 8.5 (L) 10/05/2014 0503   PROT 6.7 09/15/2015 0840   PROT 6.7 10/01/2014 2200   ALBUMIN 3.9 09/15/2015 0840   ALBUMIN 3.6 10/01/2014 2200   AST 15 09/15/2015 0840   AST 25 10/01/2014 2200   ALT 11 09/15/2015 0840   ALT 19 10/01/2014 2200    ALKPHOS 98 09/15/2015 0840   ALKPHOS 76 10/01/2014 2200   BILITOT 0.5 09/15/2015 0840   BILITOT 0.9 10/01/2014 2200   GFRNONAA 85 09/15/2015 0840   GFRNONAA >60 10/05/2014 0503   GFRAA 98 09/15/2015 0840   GFRAA >60 10/05/2014 0503    Assessment and Plan :  1. Hypercholesteremia Stable on last check in March  2. Clinical depression Stable.  3. Gastroesophageal reflux disease, esophagitis presence not specified  4. Arthritis  5. Right hand weakness - Ambulatory referral to Occupational Therapy  6. Decreased hearing, right - Ambulatory referral to ENT  7. Cognitive disorder/Alzheimer's dementia Stable per patient and her son. More than 50% of  visit spent in counseling 8. Malnutrition Patient was seen and examined by Dr. Bosie Clos and note was scribed by Samara Deist, RMA.   Julieanne Manson MD [facility] 02/08/2016 1:54 PM

## 2016-02-11 DIAGNOSIS — G309 Alzheimer's disease, unspecified: Principal | ICD-10-CM

## 2016-02-11 DIAGNOSIS — F0280 Dementia in other diseases classified elsewhere without behavioral disturbance: Secondary | ICD-10-CM | POA: Insufficient documentation

## 2016-02-13 NOTE — Telephone Encounter (Signed)
error 

## 2016-02-15 ENCOUNTER — Ambulatory Visit: Payer: Commercial Managed Care - HMO | Attending: Family Medicine | Admitting: Occupational Therapy

## 2016-02-15 DIAGNOSIS — M25641 Stiffness of right hand, not elsewhere classified: Secondary | ICD-10-CM | POA: Diagnosis present

## 2016-02-15 DIAGNOSIS — R208 Other disturbances of skin sensation: Secondary | ICD-10-CM | POA: Diagnosis present

## 2016-02-15 DIAGNOSIS — M6281 Muscle weakness (generalized): Secondary | ICD-10-CM | POA: Insufficient documentation

## 2016-02-15 NOTE — Patient Instructions (Signed)
Recommend nerve conduction test for med and ulnar N-  Penagain for easier writing -  Can get on [employer]  And mountain bike glove - with open finger to provided padding for ulnar and med N during weight bearing and pushing w/c

## 2016-02-15 NOTE — Therapy (Signed)
[facility] [address] [place], [place], [address] Phone: [phone]   Fax:  [phone]  Occupational Therapy Treatment  Patient Details  Name: Lynn [name] MRN: [id number] Date of Birth: 04/11/1927 Referring Provider: Sullivan LoneGilbert  Encounter Date: 02/15/2016      OT End of Session - 02/15/16 1903    Visit Number 1   Number of Visits 1   Date for OT Re-Evaluation 02/15/16   OT Start Time 1046   OT Stop Time 1135   OT Time Calculation (min) 49 min   Activity Tolerance Patient tolerated treatment well   Behavior During Therapy [facility] for tasks assessed/performed      No past medical history on file.  Past Surgical History:  Procedure Laterality Date  . CARPAL TUNNEL RELEASE Bilateral   . EYE SURGERY    . HERNIA REPAIR     inguinal  . HIP SURGERY    . VEIN LIGATION AND STRIPPING      There were no vitals filed for this visit.      Subjective Assessment - 02/15/16 1142    Subjective  Dr Sullivan LoneGilbert send me to you - pain in my R hand - and weaker - cannot write or grip objects, or cutting food - had it more than 2 months    Patient Stated Goals Want to get the pain and weakness in my hand better - that I can write    Currently in Pain? Yes   Pain Score 6    Pain Location Hand   Pain Orientation Right   Pain Descriptors / Indicators Aching;Numbness   Aggravating Factors  making fist - pain in palm             [facility] - 02/15/16 0001      Assessment   Diagnosis R hand weakness   Referring Provider Sullivan LoneGilbert   Onset Date 11/07/15     Balance Screen   Has the patient fallen in the past 6 months Yes   How many times? 1   Has the patient had a decrease in activity level because of a fear of falling?  Yes   Is the patient reluctant to leave their home because of a fear of falling?  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, broadcasting/film/videoteacher , Designer, jewellery hand domimant ,      Strength   Right Hand Grip (lbs) 10   Right Hand Lateral Pinch 2 lbs   Right Hand 3 Point Pinch 2 lbs   Left Hand Grip (lbs) 10   Left Hand Lateral Pinch 5 lbs   Left Hand 3 Point Pinch 9 lbs     Right Hand AROM   R Thumb Palmar ABduction/ADduction 0-45 --  compensate with RA   R Thumb Opposition to Index --  atrophy at webspace and thumb rotated , unable to do pad    R Index PIP 0-100 --  -10   R Ring PIP 0-100 --  -20                          OT Education - 02/15/16 1902    Education provided Yes   Education Details Findings and AE    Person(s)  Educated Patient;Other (comment)   Methods Explanation;Demonstration;Tactile cues;Verbal cues   Comprehension Verbal cues required;Returned demonstration;Verbalized understanding             OT Long Term Goals - 02/15/16 1911      OT LONG TERM GOAL #1   Title Pt verbalize understanding plan for AE and nerve conduction    Baseline Pt read info back - wrote down for her son    Status Achieved               Plan - 02/15/16 1904    Clinical Impression Statement Pt refer for R hand pain and weakness - pt report it started about 3 months ago - that is the time that pt also fractured hip and had rehab where she had to do a lot of weight bearing thru hands on parallel bars and walker - pushing on elbow too - pt present with complains of numbness in 4thand 5th -  decrease ADD of 5th , and atrophy of  thenar eminence -  decrease thumb RA , and opposition to do lateral and 3 point grip - pt report pain  in hand with Positive Tinel over Cubital tunnel and  tenderness  Guyons canal - would recommend at this time nerve conduction test - did recommend bike gloves for  pushing w/c with padding over MEd and Ulnar N - and Penagain at [employer] for making writing easier     Rehab Potential Fair   Clinical Impairments  Affecting Rehab Potential CTS in past - ?? where nerve compression are and how severe - cognition and memory decrease   OT Frequency One time visit   OT Treatment/Interventions Patient/family education;Self-care/ADL training   Plan Recommend Nerve conduction test   OT Home Exercise Plan AE    Consulted and Agree with Plan of Care Patient;Other (Comment)      Patient will benefit from skilled therapeutic intervention in order to improve the following deficits and impairments:  Pain, Impaired sensation, Decreased range of motion, Decreased strength  Visit Diagnosis: Other disturbances of skin sensation - Plan: Ot plan of care cert/re-cert  Stiffness of right hand, not elsewhere classified - Plan: Ot plan of care cert/re-cert  Muscle weakness (generalized) - Plan: Ot plan of care cert/re-cert    Problem List Patient Active Problem List   Diagnosis Date Noted  . Alzheimer's dementia 02/11/2016  . Cognitive disorder 01/01/2015  . Arthritis 01/01/2015  . Back pain, chronic 01/01/2015  . Bladder cystocele 01/01/2015  . Clinical depression 01/01/2015  . Acid reflux 01/01/2015  . Glaucoma 01/01/2015  . Hypercholesteremia 01/01/2015  . 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 Degeneration macular 01/01/2015  . Osteopenia 01/01/2015  . Need for prophylactic hormone replacement therapy (postmenopausal) 01/01/2015  . 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 Scoliosis 01/01/2015  . Closed fracture of neck of left femur with routine healing 11/20/2014  . Hip fracture requiring operative repair (HCC) 11/20/2014    Oletta Cohn OTR/L,CLT  02/15/2016, 7:16 PM  [facility] [address], [place], [address] Phone: [phone]   Fax:  [phone]  Name: Lynn [name] MRN: [id number] Date of Birth: 19-May-1927

## 2016-02-17 IMAGING — CR DG HIP COMPLETE 2+V*L*
1 series · 3 of 3 positions shown · non-contrast
Comparison: None.

CLINICAL DATA: Left hip pain. Limited range of motion. Initial
evaluation.

EXAM:
LEFT HIP (WITH PELVIS) 2-3 VIEWS

[Series 1: kdxr hip left complete · 0.14mm/px · 3 of 3 slices shown]
[im 1/3]
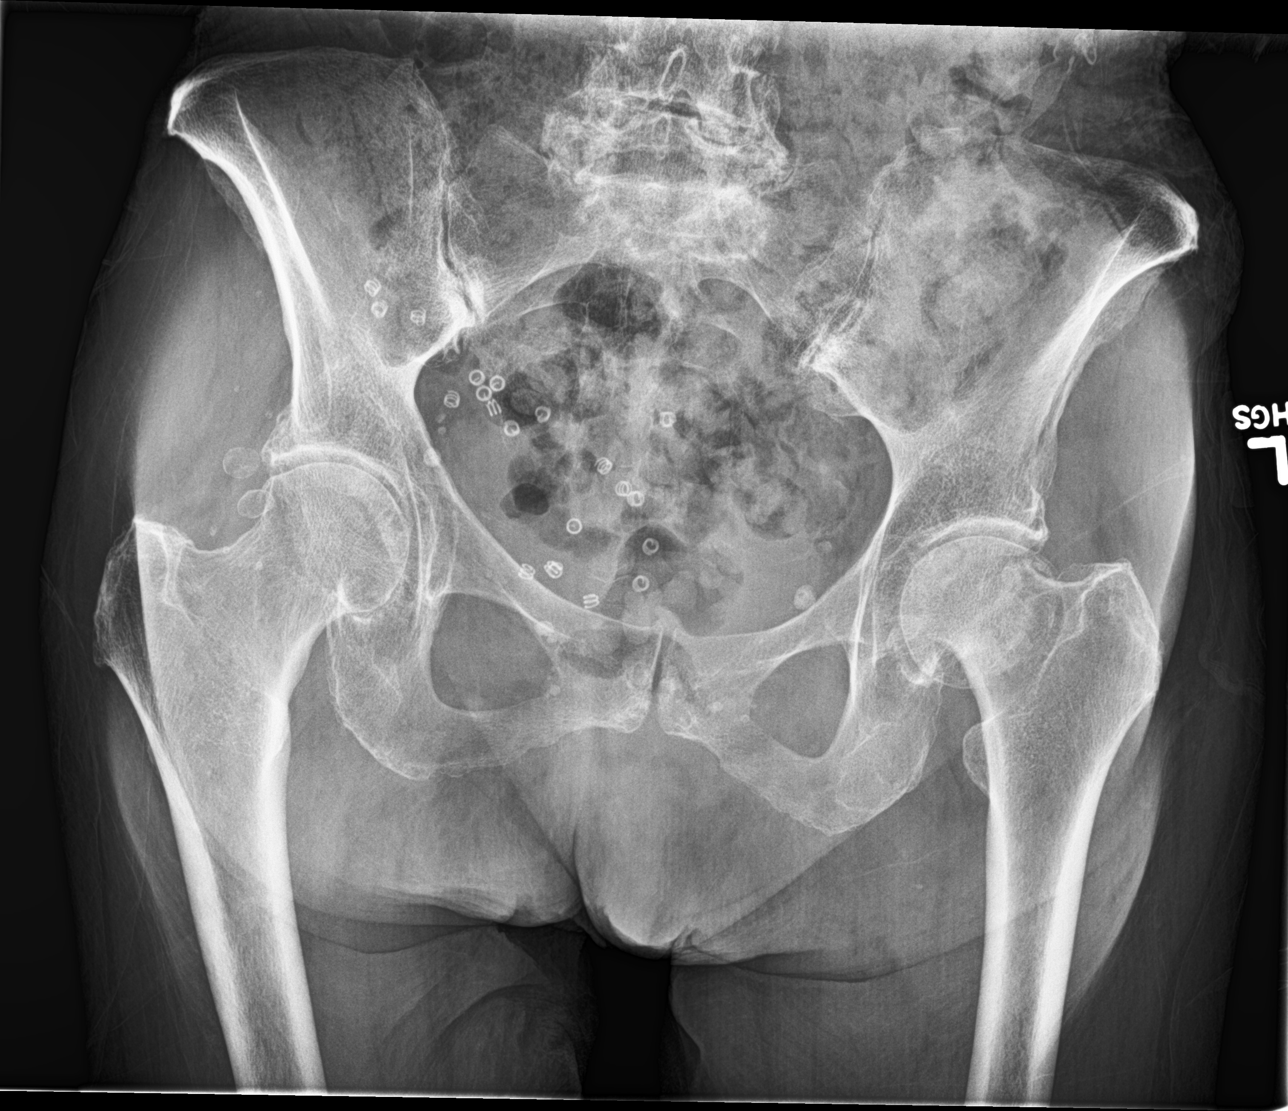
[im 2/3]
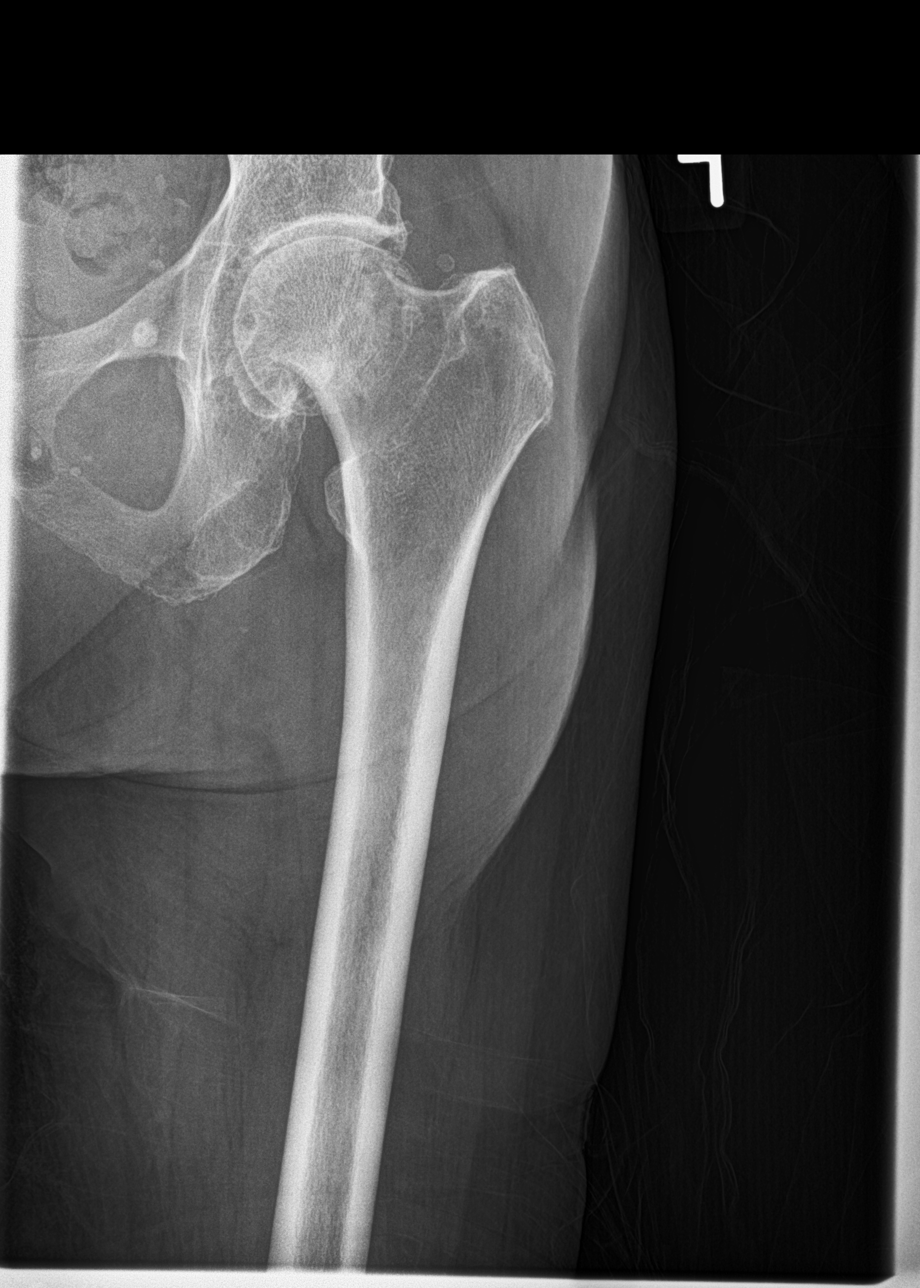
[im 3/3]
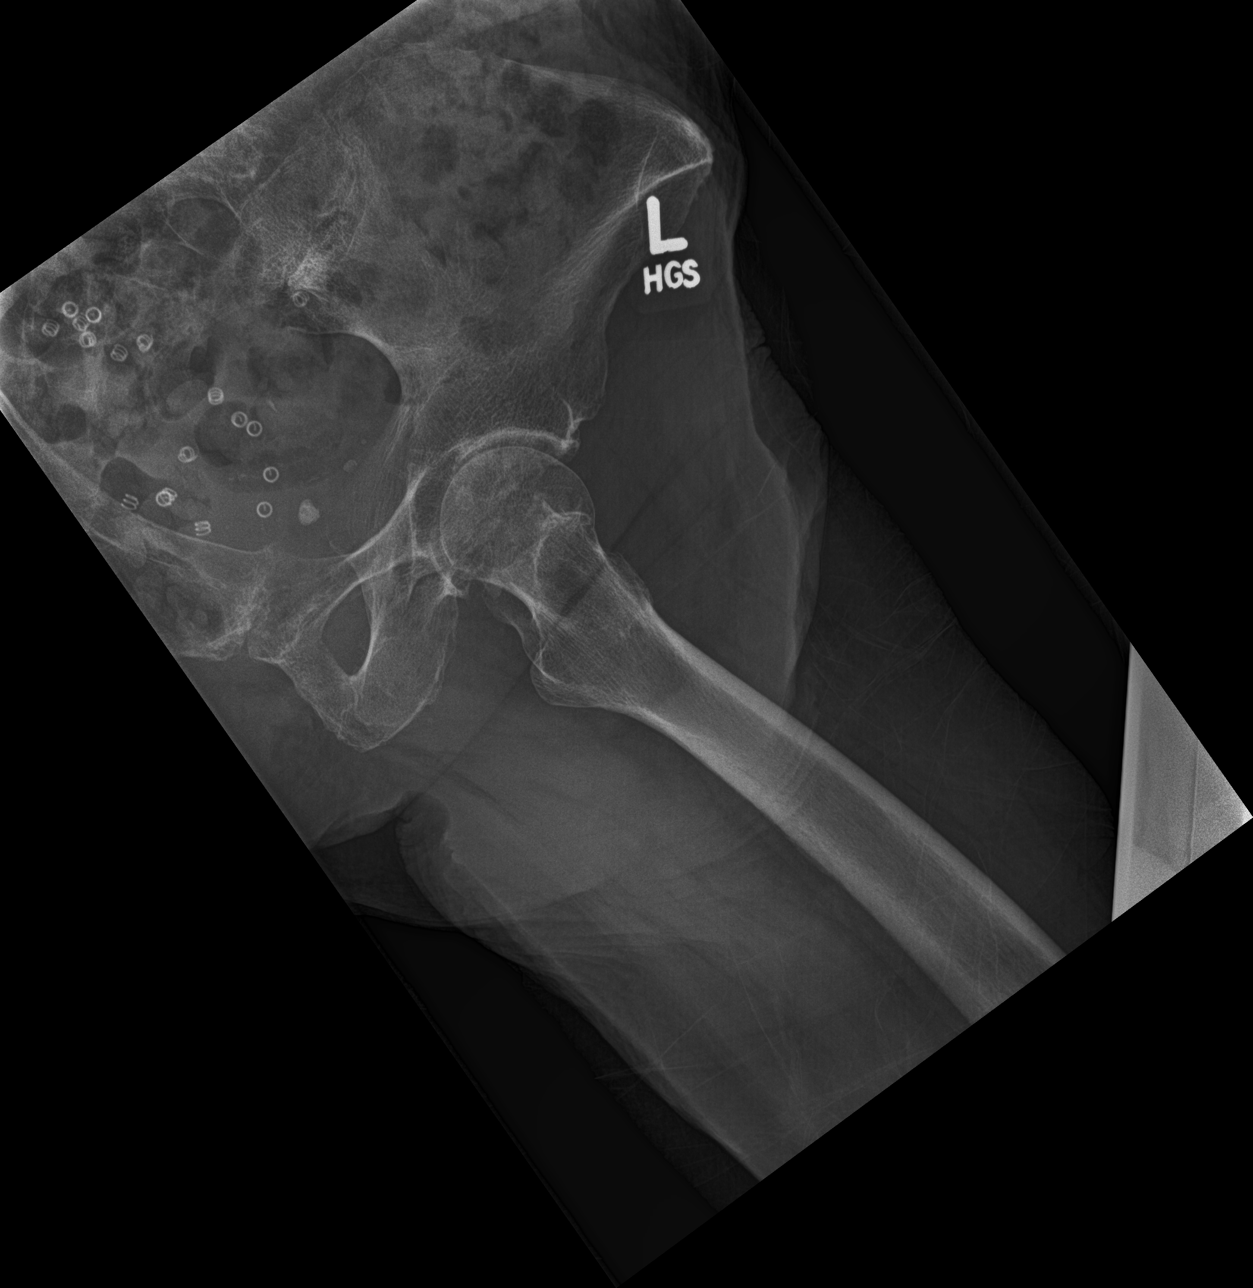

[3 of 3 positions shown; findings below may reference images not displayed]

FINDINGS: Degenerative changes lumbar spine, both SI joints, and both hips.
Left femoral neck fracture is noted. Fracture fragments are
overriding. Surgical mesh noted over the pelvis. Injection
granulomas noted.
IMPRESSION: Left femoral neck displaced fracture.

## 2016-02-17 IMAGING — CR DG CHEST 1V PORT
1 series · 1 of 1 positions shown · non-contrast
Comparison: None.

CLINICAL DATA: Preop, left hip fracture.

EXAM:
PORTABLE CHEST - 1 VIEW

[ap]
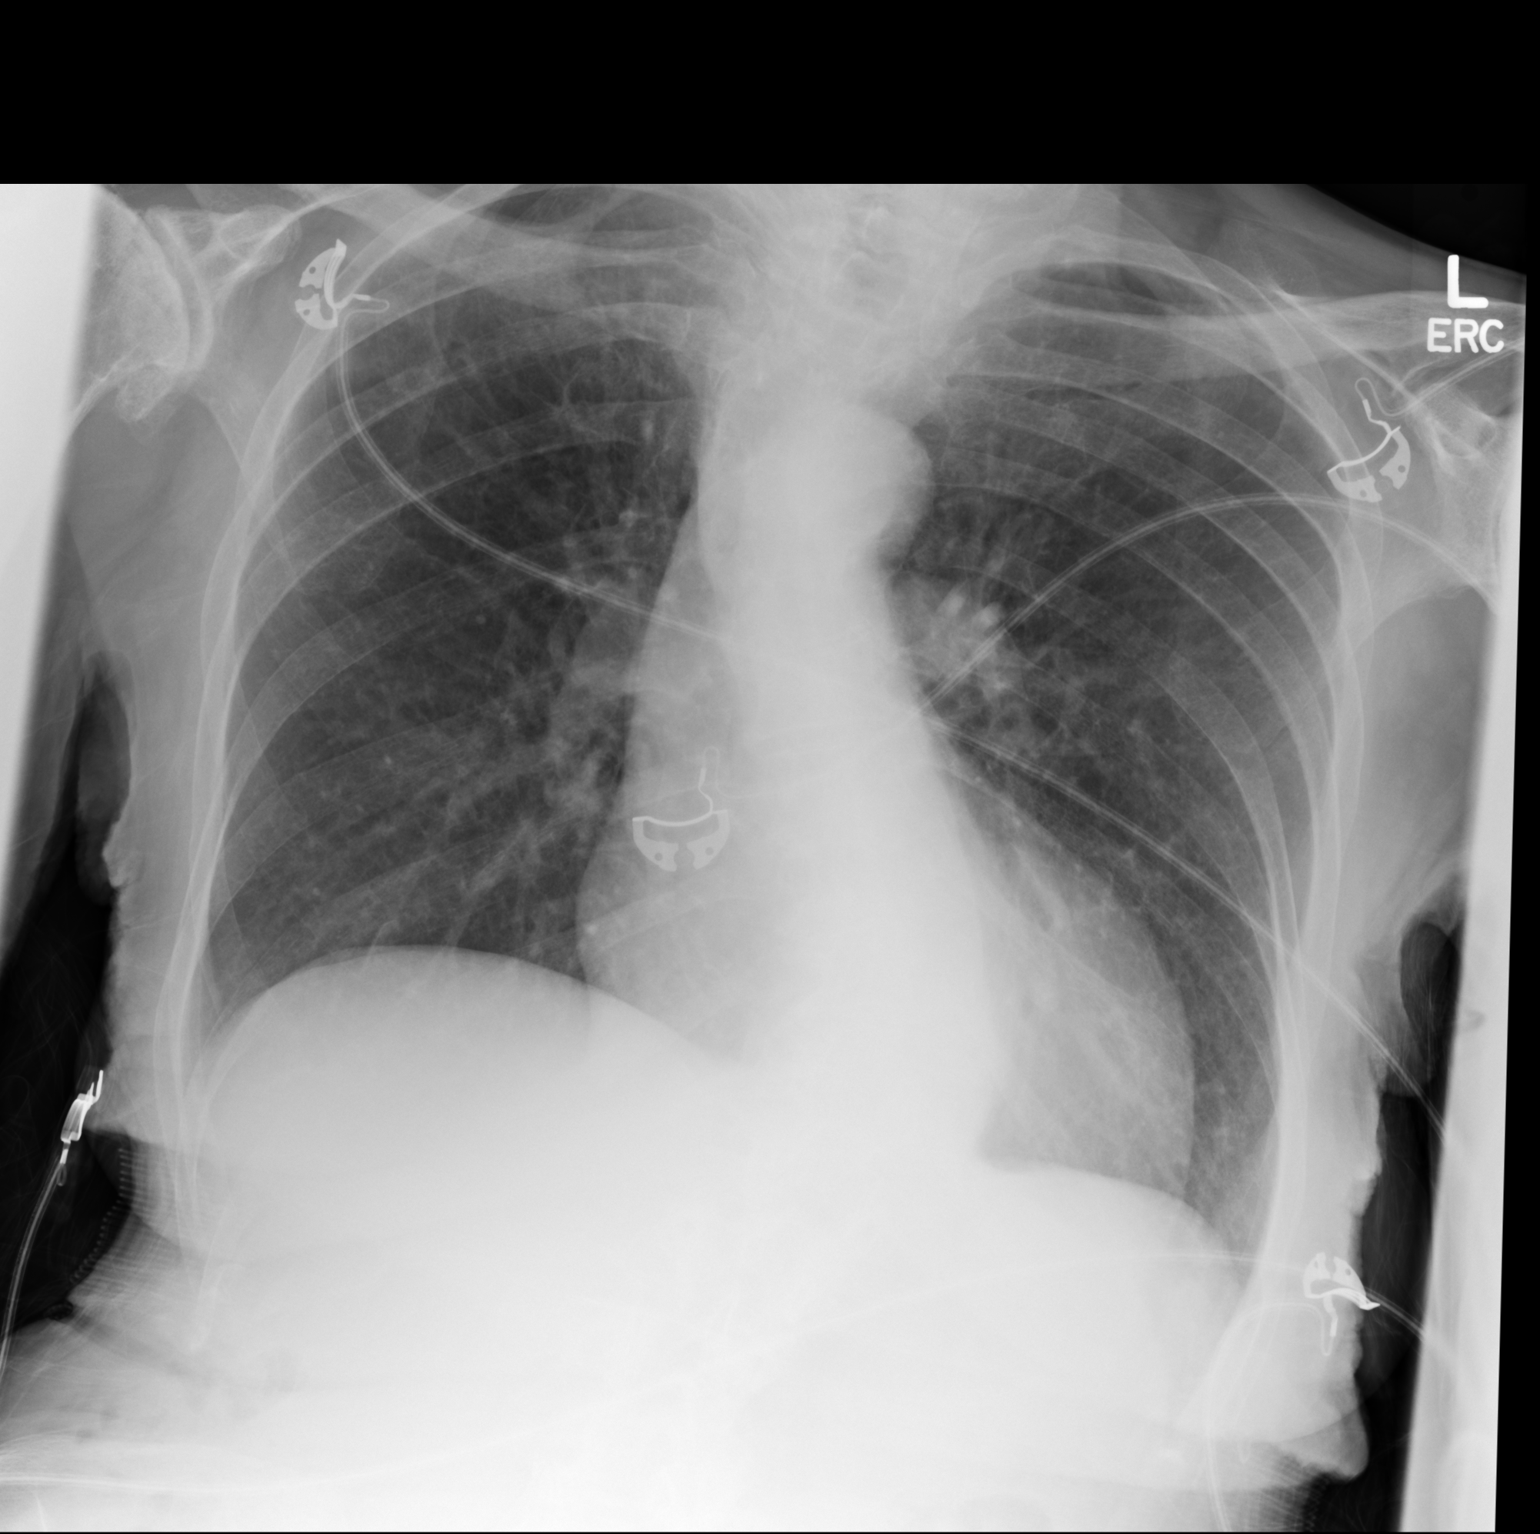

[1 of 1 positions shown; findings below may reference images not displayed]

FINDINGS: Heart is at the upper limits of normal in size. There is scoliosis
of the thoracic spine causing distortion of the bony thorax. The
lungs are clear. There is no consolidation, pleural effusion or
pneumothorax. Pulmonary vasculature is normal. No acute osseous
abnormalities, degenerative change in the right shoulder is
partially included.
IMPRESSION: No acute pulmonary process.  Scoliotic curvature of the spine.

## 2016-02-29 ENCOUNTER — Telehealth: Payer: Self-pay | Admitting: Family Medicine

## 2016-02-29 NOTE — Telephone Encounter (Signed)
This information was faxed to [facility]-aa

## 2016-02-29 NOTE — Telephone Encounter (Signed)
Benzodiazepines are not safe in this elderly female. Consider increasing sertraline to 1-1/2 tablets daily. This will take several weeks to make a difference for patient

## 2016-02-29 NOTE — Telephone Encounter (Signed)
Please advise. Lynn [name], CMA  

## 2016-02-29 NOTE — Telephone Encounter (Signed)
Lynn [name] stated they have been trying to fax a request for pt to take something for anxiety but they fax fells to send. I confirmed fax# and they have the correct fax #. Lynn [name] is requesting orders for medication for pt's anxiety. Please advise. Thanks TNP

## 2016-05-09 ENCOUNTER — Telehealth: Payer: Self-pay

## 2016-05-09 NOTE — Telephone Encounter (Signed)
Les PouKathryn N [name]  P [facility] Nurse  Phone Number: [phone]        Please call pt son, Lynn [name]. He wanted to find out about ordering labs for pt UTI.  Thanks,  KNB   Spoke with son, mark, he was asking if we received patient's urine results from last month. I did not see anything in the chart. He will contact nursing home and find out what happened and if he needs any other information he will call us back.-aa

## 2016-05-10 ENCOUNTER — Telehealth: Payer: Self-pay | Admitting: Family Medicine

## 2016-05-10 NOTE — Telephone Encounter (Signed)
Cat with [facility] called saying the family wants to start Mrs. Sobocinski on something for confusion and agitation which has been getting worse lately.    They did a urine specimen to rule out UTI, collected on 10/24 but they haven't heard anything back.  Please advise  [phone].  Thank sTeri

## 2016-05-10 NOTE — Telephone Encounter (Signed)
There is no such medication. We can increase her Aricept to 10 mg and on her next visit start Namenda 5 mg twice a day. That slows down the progression of dementia but nothing clears up the thinking.

## 2016-05-10 NOTE — Telephone Encounter (Signed)
[  please review-aa 

## 2016-05-11 MED ORDER — DONEPEZIL HCL 10 MG PO TABS: 10.0000 mg | ORAL_TABLET | Freq: Every day | ORAL | 12 refills | Status: DC

## 2016-05-11 NOTE — Telephone Encounter (Signed)
Cat advised-aa orders faxed over

## 2016-05-15 ENCOUNTER — Telehealth: Payer: Self-pay

## 2016-05-15 NOTE — Telephone Encounter (Signed)
Patients son Loraine Leriche(Mark)  had called office requesting to speak with Dr. Sullivan LoneGilbert or his CMA in regards to his mother. [name] states that his mother suffers for alzheimer's and he states that recently over the past several weeks his mother has showed disruptive behavior and had been more "vocal" in the PM at the home care she stays at. [name] is requesting that a medication be sent to pharmacy to help treat patient for anxiety. He would like to discuss medications patient is on and steps he can take to improve his mothers health. [name] is requesting a call back at [phone].KW

## 2016-05-15 NOTE — Telephone Encounter (Signed)
Please review-a

## 2016-05-16 NOTE — Telephone Encounter (Signed)
What can we start patient on if anything to help with the behavior she is having-aa

## 2016-05-16 NOTE — Telephone Encounter (Signed)
Nothing works without sedating patient. Try Xanax 0.25 mg q 5 pm daily if she is sundowning as I suspect. If it is occurring more often we can give BID or TID.

## 2016-05-17 MED ORDER — ALPRAZOLAM 0.25 MG PO TABS: ORAL_TABLET | ORAL | 5 refills | Status: DC

## 2016-05-17 NOTE — Telephone Encounter (Signed)
Patient's son mark advised. [facility]-aa

## 2016-05-30 ENCOUNTER — Telehealth: Payer: Self-pay | Admitting: Family Medicine

## 2016-05-30 NOTE — Telephone Encounter (Signed)
Tresa EndoKelly with [facility] wants an order to do Pt for 2 x's a week for 4 weeks,  Please advise.  Thank sTeri

## 2016-05-30 NOTE — Telephone Encounter (Signed)
Nurse has been advised that you are out of office this week, she states that this can be addressed next week when you return. KW

## 2016-06-04 NOTE — Telephone Encounter (Signed)
LMTCB-KW 

## 2016-06-04 NOTE — Telephone Encounter (Signed)
ok 

## 2016-06-05 NOTE — Telephone Encounter (Signed)
Left detailed voicemail stating that it is okay to proceed with PT. Renette ButtersKW

## 2016-06-06 ENCOUNTER — Ambulatory Visit (INDEPENDENT_AMBULATORY_CARE_PROVIDER_SITE_OTHER): Payer: Commercial Managed Care - HMO | Admitting: Family Medicine

## 2016-06-06 VITALS — BP 109/60 | HR 68 | Temp 99.5°F

## 2016-06-06 DIAGNOSIS — Z0000 Encounter for general adult medical examination without abnormal findings: Secondary | ICD-10-CM

## 2016-06-06 NOTE — Progress Notes (Addendum)
Subjective:   Lynn [name] is a 82 y.o. female who presents for Medicare Annual (Subsequent) preventive examination.  Review of Systems:  N/A  Cardiac Risk Factors include: advanced age (>6855men, 21>65 women)     Objective:     Vitals: BP 109/60 (BP Location: Right Arm)   Pulse 68   Temp 99.5 F (37.5 C) (Oral)   There is no height or weight on file to calculate BMI.   Tobacco History  Smoking Status  . Never Smoker  Smokeless Tobacco  . Never Used     Counseling given: Not Answered   Past Medical History:  Diagnosis Date  . Macular degeneration    Past Surgical History:  Procedure Laterality Date  . CARPAL TUNNEL RELEASE Bilateral   . EYE SURGERY    . HERNIA REPAIR     inguinal  . HIP SURGERY    . VEIN LIGATION AND STRIPPING     Family History  Problem Relation Age of Onset  . Alzheimer's disease Mother   . Pneumonia Father   . Diabetes Father   . Stroke Father   . CVA Father   . Blindness Brother     legally blind  . Hepatitis C Son    History  Sexual Activity  . Sexual activity: No    Outpatient Encounter Prescriptions as of 06/06/2016  Medication Sig  . ALPRAZolam (XANAX) 0.25 MG tablet 1 tablet once daily at 5 pm  . brinzolamide (AZOPT) 1 % ophthalmic suspension Place 1 drop into both eyes 2 (two) times daily.   . Calcium Carbonate-Vitamin D 600-400 MG-UNIT tablet TAKE 1 TABLET BY MOUTH ONCE DAILY FOR SUPPLEMENT  . Cholecalciferol (VITAMIN D3) 1000 UNITS CAPS Take by mouth.  . Coenzyme Q10 (CO Q10) 100 MG CAPS Take 1 capsule by mouth daily.  Marland Kitchen. donepezil (ARICEPT) 10 MG tablet Take 1 tablet (10 mg total) by mouth daily.  Marland Kitchen. GNP NATURAL FIBER 0.52 g capsule TAKE 2 CAPSULES BY MOUTH EACH MORNING FOR CONSTIPATION  . MAPAP 500 MG tablet TAKE 2 TABLETS (1000 MG) BY MOUTH 3 TIMES DAILY FOR PAIN OR DISCOMFORT  . mirtazapine (REMERON) 30 MG tablet TAKE 1 TABLET BY MOUTH ONCE DAILY AT BEDTIME FOR SLEEP OR AGITATION (Patient taking differently: TAKE 15  mg TABLET BY MOUTH ONCE DAILY AT BEDTIME FOR SLEEP OR AGITATION)  . Multiple Vitamin (TAB-A-VITE) TABS TAKE 1 TABLET BY MOUTH ONCE DAILY FOR SUPPLEMENT  . Multiple Vitamins-Minerals (PRESERVISION AREDS 2) CAPS Take by mouth.  Marland Kitchen. omeprazole (PRILOSEC) 20 MG capsule TAKE 1 CAPSULE BY MOUTH ONCE DAILY FOR REFLUX (Patient taking differently: TAKE 1 CAPSULE BY MOUTH every other day FOR REFLUX)  . polyvinyl alcohol (LIQUIFILM TEARS) 1.4 % ophthalmic solution Place 1 drop into both eyes 2 (two) times daily.  . sertraline (ZOLOFT) 50 MG tablet TAKE 1 TABLET BY MOUTH ONCE DAILY FOR DEPRESSION (Patient taking differently: TAKE 1.5  TABLET BY MOUTH ONCE DAILY FOR DEPRESSION)  . meclizine (ANTIVERT) 25 MG tablet Take 25 mg by mouth every 8 (eight) hours as needed for dizziness.   No facility-administered encounter medications on file as of 06/06/2016.     Activities of Daily Living In your present state of health, do you have any difficulty performing the following activities: 06/06/2016 02/08/2016  Hearing? Malvin JohnsY Y  Vision? N N  Difficulty concentrating or making decisions? N Y  Walking or climbing stairs? Y Y  Dressing or bathing? Y Y  Doing errands, shopping? Malvin JohnsY Y  Preparing  Food and eating ? Y -  Using the Toilet? N -  In the past six months, have you accidently leaked urine? N -  Do you have problems with loss of bowel control? N -  Managing your Medications? Y -  Managing your Finances? Y -  Housekeeping or managing your Housekeeping? Y -  Some recent data might be hidden    Patient Care Team: Maple Hudsonichard L Gilbert Jr., MD as PCP - General (Family Medicine) Lockie Molahadwick Brasington, MD as Referring Physician (Ophthalmology) Donato HeinzJames P Hooten, MD as Consulting Physician (Orthopedic Surgery)    Assessment:     Exercise Activities and Dietary recommendations Current Exercise Habits: The patient does not participate in regular exercise at present;Structured exercise class ([facility]), Type  of exercise: strength training/weights;stretching, Time (Minutes): 30, Frequency (Times/Week): 7, Weekly Exercise (Minutes/Week): 210, Intensity: Mild  Goals    . Increase water intake          Starting 06/06/16, I will continue to drink 3 glasses of water a day.      Fall Risk Fall Risk  06/06/2016 02/08/2016 01/25/2015  Falls in the past year? No No Yes  Number falls in past yr: - - 1  Injury with Fall? - - Yes   Depression Screen PHQ 2/9 Scores 06/06/2016 02/08/2016 01/25/2015  PHQ - 2 Score 0 0 0     Cognitive Function     6CIT Screen 06/06/2016 06/06/2016  What Year? 4 points 4 points  What month? 0 points 0 points  What time? 0 points 0 points  Count back from 20 0 points 0 points  Months in reverse 4 points 4 points  Repeat phrase 10 points 0 points  Total Score 18 8    Immunization History  Administered Date(s) Administered  . Influenza, High Dose Seasonal PF 05/08/2016  . Td 04/11/2006   Screening Tests Health Maintenance  Topic Date Due  . DEXA SCAN  06/06/2026 (Originally 10/09/1993)  . ZOSTAVAX  06/06/2026 (Originally 10/09/1988)  . TETANUS/TDAP  06/06/2026 (Originally 04/11/2016)  . PNA vac Low Risk Adult (1 of 2 - PCV13) 06/06/2026 (Originally 10/09/1993)  . INFLUENZA VACCINE  Completed      Plan:  I have personally reviewed and addressed the Medicare Annual Wellness questionnaire and have noted the following in the patient's chart:  A. Medical and social history B. Use of alcohol, tobacco or illicit drugs  C. Current medications and supplements D. Functional ability and status E.  Nutritional status F.  Physical activity G. Advance directives H. List of other physicians I.  Hospitalizations, surgeries, and ER visits in previous 12 months J.  Vitals K. Screenings such as hearing and vision if needed, cognitive and depression L. Referrals and appointments - none  In addition, I have reviewed and discussed with patient certain preventive protocols,  quality metrics, and best practice recommendations. A written personalized care plan for preventive services as well as general preventive health recommendations were provided to patient.  See attached scanned questionnaire for additional information.   Signed,  Hyacinth MeekerMckenzie Markoski, LPN Nurse Health Advisor  MD Recommendations: none.  I have reviewed the health advisors note, was  available for consultation and I agree with documentation and plan. Julieanne Mansonichard Gilbert MD [facility]  Patient is becoming increasingly weak and debilitated. Fall risk is increasing. I think physical therapy is indicated to help the fall risk and see if she can get some strength back.

## 2016-06-06 NOTE — Patient Instructions (Signed)
Ms. Yolanda MangesClaar , Thank you for taking time to come for your Medicare Wellness Visit. I appreciate your ongoing commitment to your health goals. Please review the following plan we discussed and let me know if I can assist you in the future.   These are the goals we discussed: Goals    . Increase water intake          Starting 06/06/16, I will continue to drink 3 glasses of water a day.       This is a list of the screening recommended for you and due dates:  Health Maintenance  Topic Date Due  . DEXA scan (bone density measurement)  06/06/2026*  . Shingles Vaccine  06/06/2026*  . Tetanus Vaccine  06/06/2026*  . Pneumonia vaccines (1 of 2 - PCV13) 06/06/2026*  . Flu Shot  Completed  *Topic was postponed. The date shown is not the original due date.   Preventive Care for Adults  A healthy lifestyle and preventive care can promote health and wellness. Preventive health guidelines for adults include the following key practices.  . A routine yearly physical is a good way to check with your health care provider about your health and preventive screening. It is a chance to share any concerns and updates on your health and to receive a thorough exam.  . Visit your dentist for a routine exam and preventive care every 6 months. Brush your teeth twice a day and floss once a day. Good oral hygiene prevents tooth decay and gum disease.  . The frequency of eye exams is based on your age, health, family medical history, use  of contact lenses, and other factors. Follow your health care provider's ecommendations for frequency of eye exams.  . Eat a healthy diet. Foods like vegetables, fruits, whole grains, low-fat dairy products, and lean protein foods contain the nutrients you need without too many calories. Decrease your intake of foods high in solid fats, added sugars, and salt. Eat the right amount of calories for you. Get information about a proper diet from your health care provider, if  necessary.  . Regular physical exercise is one of the most important things you can do for your health. Most adults should get at least 150 minutes of moderate-intensity exercise (any activity that increases your heart rate and causes you to sweat) each week. In addition, most adults need muscle-strengthening exercises on 2 or more days a week.  Silver Sneakers may be a benefit available to you. To determine eligibility, you may visit the website: www.silversneakers.com or contact program at [phone] Mon-Fri between 8AM-8PM.   . Maintain a healthy weight. The body mass index (BMI) is a screening tool to identify possible weight problems. It provides an estimate of body fat based on height and weight. Your health care provider can find your BMI and can help you achieve or maintain a healthy weight.   For adults 20 years and older: ? A BMI below 18.5 is considered underweight. ? A BMI of 18.5 to 24.9 is normal. ? A BMI of 25 to 29.9 is considered overweight. ? A BMI of 30 and above is considered obese.   . Maintain normal blood lipids and cholesterol levels by exercising and minimizing your intake of saturated fat. Eat a balanced diet with plenty of fruit and vegetables. Blood tests for lipids and cholesterol should begin at age 80 and be repeated every 5 years. If your lipid or cholesterol levels are high, you are over 50, or you  are at high risk for heart disease, you may need your cholesterol levels checked more frequently. Ongoing high lipid and cholesterol levels should be treated with medicines if diet and exercise are not working.  . If you smoke, find out from your health care provider how to quit. If you do not use tobacco, please do not start.  . If you choose to drink alcohol, please do not consume more than 2 drinks per day. One drink is considered to be 12 ounces (355 mL) of beer, 5 ounces (148 mL) of wine, or 1.5 ounces (44 mL) of liquor.  . If you are 66-51 years old, ask your  health care provider if you should take aspirin to prevent strokes.  . Use sunscreen. Apply sunscreen liberally and repeatedly throughout the day. You should seek shade when your shadow is shorter than you. Protect yourself by wearing long sleeves, pants, a wide-brimmed hat, and sunglasses year round, whenever you are outdoors.  . Once a month, do a whole body skin exam, using a mirror to look at the skin on your back. Tell your health care provider of new moles, moles that have irregular borders, moles that are larger than a pencil eraser, or moles that have changed in shape or color.

## 2016-06-12 ENCOUNTER — Telehealth: Payer: Self-pay | Admitting: Family Medicine

## 2016-06-12 ENCOUNTER — Other Ambulatory Visit: Payer: Self-pay | Admitting: Family Medicine

## 2016-06-12 NOTE — Telephone Encounter (Signed)
Lynn [name] with [facility] is requesting in writing that pt will require physical therapy for a face to face encounter for Medicare to cover the [facility].  Fax #[phone].  CB#[phone]/MW

## 2016-06-12 NOTE — Telephone Encounter (Signed)
[facility] verbal order for occupational therapy, 2 times a week for 4 weeks.  [phone]/WJCB#[phone]/MW

## 2016-06-12 NOTE — Telephone Encounter (Signed)
Please review-aa 

## 2016-06-12 NOTE — Telephone Encounter (Signed)
Please advise. Thanks.  

## 2016-06-13 ENCOUNTER — Other Ambulatory Visit: Payer: Self-pay | Admitting: Family Medicine

## 2016-06-13 NOTE — Telephone Encounter (Signed)
ok 

## 2016-06-13 NOTE — Telephone Encounter (Signed)
Verbal order given to [name] with [facility].  

## 2016-06-18 NOTE — Telephone Encounter (Signed)
lmtcb for Arline AspCindy to ask what exactly we need to write -aa

## 2016-06-18 NOTE — Telephone Encounter (Signed)
?  OK? I am not sure what the request is.

## 2016-06-19 NOTE — Telephone Encounter (Signed)
note faxed-aa

## 2016-06-19 NOTE — Telephone Encounter (Signed)
Short addendum has been added to 06/06/16 note mentioning the need for physical therapy

## 2016-06-19 NOTE — Telephone Encounter (Signed)
Spoke with Lynn [name] for Medicare to cover physical therapy they need for you to add addendum to the note in November when patient saw McKenzie for Wellness visit stating patient needs physical therapy. Please let me know when this is done and I will fax the note to them-aa thank you

## 2016-06-20 ENCOUNTER — Other Ambulatory Visit: Payer: Self-pay | Admitting: Family Medicine

## 2016-06-25 ENCOUNTER — Other Ambulatory Visit: Payer: Self-pay | Admitting: Family Medicine

## 2016-06-25 DIAGNOSIS — R4189 Other symptoms and signs involving cognitive functions and awareness: Secondary | ICD-10-CM | POA: Diagnosis not present

## 2016-06-25 DIAGNOSIS — G8929 Other chronic pain: Secondary | ICD-10-CM | POA: Diagnosis not present

## 2016-06-25 DIAGNOSIS — M1990 Unspecified osteoarthritis, unspecified site: Secondary | ICD-10-CM | POA: Diagnosis not present

## 2016-06-25 DIAGNOSIS — H3530 Unspecified macular degeneration: Secondary | ICD-10-CM | POA: Diagnosis not present

## 2016-06-25 DIAGNOSIS — M419 Scoliosis, unspecified: Secondary | ICD-10-CM | POA: Diagnosis not present

## 2016-06-25 MED ORDER — CO Q10 100 MG PO CAPS: 1.0000 | ORAL_CAPSULE | Freq: Every day | ORAL | 12 refills | Status: DC

## 2016-07-13 ENCOUNTER — Other Ambulatory Visit: Payer: Self-pay | Admitting: Family Medicine

## 2016-09-10 ENCOUNTER — Other Ambulatory Visit: Payer: Self-pay | Admitting: Family Medicine

## 2016-10-06 ENCOUNTER — Other Ambulatory Visit: Payer: Self-pay | Admitting: Family Medicine

## 2016-10-08 ENCOUNTER — Ambulatory Visit: Payer: Commercial Managed Care - HMO | Admitting: Family Medicine

## 2016-10-09 ENCOUNTER — Other Ambulatory Visit: Payer: Self-pay | Admitting: Family Medicine

## 2016-11-05 ENCOUNTER — Other Ambulatory Visit: Payer: Self-pay | Admitting: Family Medicine

## 2016-11-08 ENCOUNTER — Telehealth: Payer: Self-pay | Admitting: Family Medicine

## 2016-11-08 NOTE — Telephone Encounter (Signed)
Kat with [facility] is requesting a written order to collect a urine samples.  Fax #[phone].  CB#[phone]/MW

## 2016-11-08 NOTE — Telephone Encounter (Signed)
ok 

## 2016-11-08 NOTE — Telephone Encounter (Signed)
Ok to do?-aa 

## 2016-11-08 NOTE — Telephone Encounter (Signed)
Order faxed-aa 

## 2016-11-23 ENCOUNTER — Encounter: Payer: Self-pay | Admitting: Family Medicine

## 2016-12-12 ENCOUNTER — Other Ambulatory Visit: Payer: Self-pay | Admitting: Family Medicine

## 2016-12-26 ENCOUNTER — Encounter: Payer: Self-pay | Admitting: Family Medicine

## 2016-12-26 ENCOUNTER — Ambulatory Visit (INDEPENDENT_AMBULATORY_CARE_PROVIDER_SITE_OTHER): Payer: Medicare Other | Admitting: Family Medicine

## 2016-12-26 VITALS — BP 108/52 | HR 68 | Temp 98.2°F | Resp 16

## 2016-12-26 DIAGNOSIS — E7800 Pure hypercholesterolemia, unspecified: Secondary | ICD-10-CM | POA: Diagnosis not present

## 2016-12-26 DIAGNOSIS — G309 Alzheimer's disease, unspecified: Secondary | ICD-10-CM

## 2016-12-26 DIAGNOSIS — S0990XA Unspecified injury of head, initial encounter: Secondary | ICD-10-CM

## 2016-12-26 DIAGNOSIS — F0280 Dementia in other diseases classified elsewhere without behavioral disturbance: Secondary | ICD-10-CM

## 2016-12-26 DIAGNOSIS — W19XXXA Unspecified fall, initial encounter: Secondary | ICD-10-CM | POA: Diagnosis not present

## 2016-12-26 NOTE — Progress Notes (Signed)
Patient: Lynn [name] Female    DOB: Oct 08, 1926   83 y.o.   MRN: [id number] Visit Date: 12/26/2016  Today's Provider: Megan Mans, MD   Chief Complaint  Patient presents with  . Memory Loss   Subjective:    HPI   Memory Loss Lynn [name] is presenting to office to discuss memory loss. This has been occurring since she fell out of her wheelchair and bumped her head (over left eye). Her symptoms include hypersomnia, not finishing sentences, and she is not as active. She is staying in her room more than she usually does. She denies nausea, vomiting, headaches, visual changes, photosensitivity. There was no loss of consciousness after bumping her head.  MMSE - Mini Mental State Exam 12/26/2016  Orientation to time 1  Orientation to Place 4  Registration 3  Attention/ Calculation 0  Recall 0  Language- name 2 objects 2  Language- repeat 1  Language- follow 3 step command 2  Language- read & follow direction 1  Write a sentence 0  Copy design 0  Total score 14     Allergies  Allergen Reactions  . Sulfa Antibiotics Other (See Comments)    Reaction: Pt does not remember.     Current Outpatient Prescriptions:  .  ALPRAZolam (XANAX) 0.25 MG tablet, TAKE 1 TABLET BY MOUTH ONCE DAILY AT 5 PM, Disp: 30 tablet, Rfl: 2 .  brinzolamide (AZOPT) 1 % ophthalmic suspension, Place 1 drop into both eyes 2 (two) times daily. , Disp: , Rfl:  .  Calcium Carbonate-Vitamin D 600-400 MG-UNIT tablet, TAKE 1 TABLET BY MOUTH ONCE DAILY FOR SUPPLEMENT, Disp: 30 tablet, Rfl: 12 .  Cholecalciferol (VITAMIN D3) 1000 UNITS CAPS, Take by mouth., Disp: , Rfl:  .  donepezil (ARICEPT) 10 MG tablet, Take 1 tablet (10 mg total) by mouth daily., Disp: 30 tablet, Rfl: 12 .  GNP NATURAL FIBER 0.52 g capsule, TAKE 2 CAPSULES BY MOUTH EACH MORNING FOR CONSTIPATION, Disp: 60 capsule, Rfl: 11 .  MAPAP 500 MG tablet, TAKE 2 TABLETS (1000 MG) BY MOUTH 3 TIMES DAILY FOR PAIN OR DISCOMFORT, Disp: 150 tablet,  Rfl: 5 .  Multiple Vitamins-Minerals (PRESERVISION AREDS 2) CAPS, Take by mouth., Disp: , Rfl:  .  omeprazole (PRILOSEC) 20 MG capsule, TAKE 1 CAPSULE BY MOUTH ONCE DAILY FOR REFLUX (Patient taking differently: TAKE 1 CAPSULE BY MOUTH every other day FOR REFLUX), Disp: 30 capsule, Rfl: 12 .  polyvinyl alcohol (LIQUIFILM TEARS) 1.4 % ophthalmic solution, Place 1 drop into both eyes 2 (two) times daily., Disp: , Rfl:  .  sertraline (ZOLOFT) 50 MG tablet, TAKE 1 TABLET BY MOUTH ONCE DAILY FOR DEPRESSION (Patient taking differently: TAKE 1.5  TABLET BY MOUTH ONCE DAILY FOR DEPRESSION), Disp: 30 tablet, Rfl: 12 .  Coenzyme Q10 (CO Q10) 100 MG CAPS, Take 1 capsule by mouth daily. (Patient not taking: Reported on 12/26/2016), Disp: 30 each, Rfl: 12 .  meclizine (ANTIVERT) 25 MG tablet, Take 25 mg by mouth every 8 (eight) hours as needed for dizziness., Disp: , Rfl:  .  mirtazapine (REMERON) 30 MG tablet, TAKE 1 TABLET BY MOUTH ONCE DAILY AT BEDTIME FOR SLEEP OR AGITATION (Patient not taking: Reported on 12/26/2016), Disp: 30 tablet, Rfl: 12 .  Multiple Vitamin (TAB-A-VITE) TABS, TAKE 1 TABLET BY MOUTH ONCE DAILY FOR SUPPLEMENT. (Patient not taking: Reported on 12/26/2016), Disp: 30 each, Rfl: 11  Review of Systems  Constitutional: Positive for activity change. Negative for appetite  change, chills, diaphoresis, fatigue, fever and unexpected weight change.  Respiratory: Negative for shortness of breath.   Cardiovascular: Negative for chest pain, palpitations and leg swelling.  Neurological: Negative for syncope and headaches.  Psychiatric/Behavioral: Positive for confusion and decreased concentration. Negative for sleep disturbance.    Social History  Substance Use Topics  . Smoking status: Never Smoker  . Smokeless tobacco: Never Used  . Alcohol use No   Objective:   BP (!) 108/52 (BP Location: Left Arm, Patient Position: Sitting, Cuff Size: Normal)   Pulse 68   Temp 98.2 F (36.8 C) (Oral)   Resp  16  Vitals:   12/26/16 1450  BP: (!) 108/52  Pulse: 68  Resp: 16  Temp: 98.2 F (36.8 C)  TempSrc: Oral     Physical Exam  Constitutional: She is oriented to person, place, and time. She appears well-developed and well-nourished.  Cachectic WFNAD.  HENT:  Head: Normocephalic and atraumatic.  Eyes: Conjunctivae are normal. No scleral icterus.  Neck: Normal range of motion. No thyromegaly present.  Cardiovascular: Normal rate, regular rhythm and normal heart sounds.   Pulmonary/Chest: Effort normal and breath sounds normal. No respiratory distress.  Abdominal: Soft.  Musculoskeletal: She exhibits no tenderness (no tenderness over neck or spine).  Neurological: She is alert and oriented to person, place, and time.  Skin: Skin is warm and dry.  Psychiatric: She has a normal mood and affect. Her behavior is normal. Judgment and thought content normal.        Assessment & Plan:     1. Fall, initial encounter New problem. D/C Sertraline today. No syncope.  2. Injury of head, initial encounter Due to fall. Will obtain CT head to R/O subdural hematoma.  - CT Head Wo Contrast  3. Alzheimer's dementia without behavioral disturbance, unspecified timing of dementia onset MMSE 14 today. Will continue Aricept. Today's sx most likely due to concussion from fall. More than 50% of 25 minute visit spent in counselling. 4visit . Hypercholesteremia Will check non-fasting labs. FU pending labs. - Comprehensive metabolic panel - CBC with Differential/Platelet - TSH - Lipid panel      I have done the exam and reviewed the above chart and it is accurate to the best of my knowledge. DentistDragon  technology has been used in this note in any air is in the dictation or transcription are unintentional.  Megan Mansichard Alanson Hausmann Jr, MD  [facility]

## 2016-12-27 LAB — COMPREHENSIVE METABOLIC PANEL WITH GFR
ALT: 9 IU/L (ref 0–32)
AST: 12 IU/L (ref 0–40)
Albumin/Globulin Ratio: 1.2 (ref 1.2–2.2)
Albumin: 3.7 g/dL (ref 3.5–4.7)
Alkaline Phosphatase: 86 IU/L (ref 39–117)
BUN/Creatinine Ratio: 23 (ref 12–28)
BUN: 14 mg/dL (ref 8–27)
Bilirubin Total: <0.2 mg/dL (ref 0.0–1.2)
CO2: 26 mmol/L (ref 20–29)
Calcium: 9.1 mg/dL (ref 8.7–10.3)
Chloride: 102 mmol/L (ref 96–106)
Creatinine, Ser: 0.6 mg/dL (ref 0.57–1.00)
GFR calc Af Amer: 94 mL/min/1.73
GFR calc non Af Amer: 82 mL/min/1.73
Globulin, Total: 3 g/dL (ref 1.5–4.5)
Glucose: 110 mg/dL — ABNORMAL HIGH (ref 65–99)
Potassium: 4.3 mmol/L (ref 3.5–5.2)
Sodium: 140 mmol/L (ref 134–144)
Total Protein: 6.7 g/dL (ref 6.0–8.5)

## 2016-12-27 LAB — CBC WITH DIFFERENTIAL/PLATELET
Basophils Absolute: 0 x10E3/uL (ref 0.0–0.2)
Basos: 1 %
EOS (ABSOLUTE): 0 x10E3/uL (ref 0.0–0.4)
Eos: 1 %
Hematocrit: 35.3 % (ref 34.0–46.6)
Hemoglobin: 11.5 g/dL (ref 11.1–15.9)
Immature Grans (Abs): 0 x10E3/uL (ref 0.0–0.1)
Immature Granulocytes: 0 %
Lymphocytes Absolute: 2.3 x10E3/uL (ref 0.7–3.1)
Lymphs: 37 %
MCH: 29.1 pg (ref 26.6–33.0)
MCHC: 32.6 g/dL (ref 31.5–35.7)
MCV: 89 fL (ref 79–97)
Monocytes Absolute: 0.6 x10E3/uL (ref 0.1–0.9)
Monocytes: 9 %
Neutrophils Absolute: 3.3 x10E3/uL (ref 1.4–7.0)
Neutrophils: 52 %
Platelets: 405 x10E3/uL — ABNORMAL HIGH (ref 150–379)
RBC: 3.95 x10E6/uL (ref 3.77–5.28)
RDW: 14 % (ref 12.3–15.4)
WBC: 6.3 x10E3/uL (ref 3.4–10.8)

## 2016-12-27 LAB — LIPID PANEL
Chol/HDL Ratio: 3.1 ratio (ref 0.0–4.4)
Cholesterol, Total: 182 mg/dL (ref 100–199)
HDL: 59 mg/dL (ref >39)
LDL CALC: 103 mg/dL — AB (ref 0–99)
Triglycerides: 99 mg/dL (ref 0–149)
VLDL CHOLESTEROL CAL: 20 mg/dL (ref 5–40)

## 2016-12-27 LAB — TSH: TSH: 0.794 u[IU]/mL (ref 0.450–4.500)

## 2016-12-27 NOTE — Progress Notes (Signed)
Advised  ED 

## 2017-01-05 ENCOUNTER — Other Ambulatory Visit: Payer: Self-pay | Admitting: Family Medicine

## 2017-01-08 ENCOUNTER — Ambulatory Visit
Admission: RE | Admit: 2017-01-08 | Discharge: 2017-01-08 | Disposition: A | Discharge status: Home / Self Care | Payer: Medicare Other | Source: Ambulatory Visit | Attending: Family Medicine | Admitting: Family Medicine

## 2017-01-08 ENCOUNTER — Other Ambulatory Visit: Payer: Self-pay | Admitting: Family Medicine

## 2017-01-08 DIAGNOSIS — I6782 Cerebral ischemia: Secondary | ICD-10-CM | POA: Insufficient documentation

## 2017-01-08 DIAGNOSIS — G319 Degenerative disease of nervous system, unspecified: Secondary | ICD-10-CM | POA: Insufficient documentation

## 2017-01-08 DIAGNOSIS — S0990XA Unspecified injury of head, initial encounter: Secondary | ICD-10-CM | POA: Diagnosis present

## 2017-01-08 DIAGNOSIS — H748X1 Other specified disorders of right middle ear and mastoid: Secondary | ICD-10-CM | POA: Insufficient documentation

## 2017-01-08 NOTE — Telephone Encounter (Signed)
Please review for dr gilbert-aa 

## 2017-01-08 NOTE — Telephone Encounter (Signed)
Please call in alprazolam.  

## 2017-01-10 NOTE — Telephone Encounter (Signed)
rx called in-aa 

## 2017-01-21 ENCOUNTER — Other Ambulatory Visit: Payer: Self-pay | Admitting: Family Medicine

## 2017-01-31 ENCOUNTER — Telehealth: Payer: Self-pay | Admitting: Family Medicine

## 2017-01-31 DIAGNOSIS — R410 Disorientation, unspecified: Secondary | ICD-10-CM

## 2017-01-31 NOTE — Telephone Encounter (Signed)
Lida with [facility] is requesting the UA order refaxed.  CB#[phone]/MW

## 2017-01-31 NOTE — Telephone Encounter (Signed)
Please review-aa 

## 2017-02-01 NOTE — Telephone Encounter (Signed)
ok 

## 2017-02-01 NOTE — Telephone Encounter (Signed)
Printed a lab slip with order for UA and faxed to [phone]-aa

## 2017-02-12 ENCOUNTER — Telehealth: Payer: Self-pay | Admitting: Family Medicine

## 2017-02-12 NOTE — Telephone Encounter (Signed)
Error/MW °

## 2017-03-12 ENCOUNTER — Telehealth: Payer: Self-pay | Admitting: Family Medicine

## 2017-03-12 NOTE — Telephone Encounter (Signed)
Sandy with [facility] is needing a verbal order to change the date on the original order that was sent last week.  She would like for the order to have a start date of 03/12/17. For memory care per [facility]

## 2017-03-12 NOTE — Telephone Encounter (Signed)
ok 

## 2017-03-12 NOTE — Telephone Encounter (Signed)
Sandy advised

## 2017-03-12 NOTE — Telephone Encounter (Signed)
[facility]

## 2017-03-12 NOTE — Telephone Encounter (Signed)
p states his mom takes anxiety medication at 9pm and he wants to know if they can change the pm dose to 5 pm She goes to bed at 7:30 pm.  [facility],  His call back is [phone]  Thanks teri

## 2017-03-12 NOTE — Telephone Encounter (Signed)
Please review-aa 

## 2017-03-15 ENCOUNTER — Telehealth: Payer: Self-pay | Admitting: Emergency Medicine

## 2017-03-15 NOTE — Telephone Encounter (Signed)
Sandy a speech therapist would like verbal order for cognitive therapy, staff and family education. Please advise. Thanks   CB# [phone]

## 2017-03-16 NOTE — Telephone Encounter (Signed)
ok 

## 2017-03-18 NOTE — Telephone Encounter (Signed)
Sandy advised-Thaila Bottoms V Anis Degidio, RMA  

## 2017-03-26 ENCOUNTER — Telehealth: Payer: Self-pay | Admitting: Family Medicine

## 2017-03-26 NOTE — Telephone Encounter (Signed)
Please review-Lynn [name] V Lynn [name], RMA  

## 2017-03-26 NOTE — Telephone Encounter (Signed)
Gave the verbal order to Aurea Graff as Ok per Dr.Gilbert.  Thanks,  -Okley Magnussen

## 2017-03-26 NOTE — Telephone Encounter (Addendum)
Lynn [name] with [facility] called to request a verbal order for Occupational Therapy for 1 time a week for 3 weeks and 2 times a week for 2 weeks.  This is for transfers, balance and ADL independence with care givers.  CB#[phone]/MW

## 2017-03-26 NOTE — Telephone Encounter (Signed)
ok 

## 2017-04-29 ENCOUNTER — Ambulatory Visit (INDEPENDENT_AMBULATORY_CARE_PROVIDER_SITE_OTHER): Payer: Medicare Other | Admitting: Podiatry

## 2017-04-29 VITALS — BP 113/75 | HR 71 | Temp 99.3°F | Resp 16

## 2017-04-29 DIAGNOSIS — L84 Corns and callosities: Secondary | ICD-10-CM | POA: Diagnosis not present

## 2017-04-29 DIAGNOSIS — M2011 Hallux valgus (acquired), right foot: Secondary | ICD-10-CM

## 2017-04-29 DIAGNOSIS — M2042 Other hammer toe(s) (acquired), left foot: Secondary | ICD-10-CM

## 2017-04-29 DIAGNOSIS — M2041 Other hammer toe(s) (acquired), right foot: Secondary | ICD-10-CM | POA: Diagnosis not present

## 2017-04-29 DIAGNOSIS — M2012 Hallux valgus (acquired), left foot: Secondary | ICD-10-CM

## 2017-04-29 DIAGNOSIS — B351 Tinea unguium: Secondary | ICD-10-CM

## 2017-04-29 DIAGNOSIS — M79674 Pain in right toe(s): Secondary | ICD-10-CM | POA: Diagnosis not present

## 2017-04-29 DIAGNOSIS — M79675 Pain in left toe(s): Secondary | ICD-10-CM | POA: Diagnosis not present

## 2017-04-29 NOTE — Progress Notes (Signed)
   Subjective:    Patient ID: Lynn [name], female    DOB: 11/04/1926, 83 y.o.   MRN: [id number]  HPI this patient presents the office for an evaluation of both of her feet.  She is brought to the office by her daughter-in-law who requests returning feet to be examined and her nails treated.  She says that the nails are painful walking and wearing her shoes.  She also has a painful second digit on both feet with the left greater than the right.  She presents the office today for an evaluation and treatment of her painful feet    Review of Systems  Musculoskeletal: Positive for gait problem.  Hematological: Bruises/bleeds easily.  All other systems reviewed and are negative.      Objective:   Physical Exam General Appearance  Alert, conversant and in no acute stress.  Vascular  Dorsalis pedis  Are weakly  palpable  Bilaterally. Posterior tibial pulses are absent bilaterally  Capillary return is within normal limits  Bilaterally. Cold feet noted bilaterally  Neurologic  Senn-Weinstein monofilament wire test within normal limits  bilaterally. Muscle power  Within normal limits bilaterally.  Nails Thick disfigured discolored nails with subungual debride bilaterally from hallux to fifth toes bilaterally. No evidence of bacterial infection or drainage bilaterally.  Orthopedic  No limitations of motion of motion feet bilaterally.  No crepitus or effusions noted.  Severe HAV deformities with second hammertoes overlapping HAV deformities.  Skin  normotropic skin with no porokeratosis noted bilaterally.  No signs of infections or ulcers noted.  Skin lesions noted at the PIPJ of the second toes bilaterally with no evidence of any fluctuance.        Assessment & Plan:  Onychomycosis  B/L  HAV   HT second with heloma durum second  B/L.  IE  Debridement of nails 10.  Diabetic foot exam was performed.  There is a heloma durum noted on the dorsum of the second digits bilaterally with no redness,  fluctuance or drainage. Patient was very sensitive since her nails were so long that I was unable to trim the nails as I normally shorten them.  Helane Gunther.   Kees Idrovo DPM   Helane GuntherGregory Ondine Gemme DPM

## 2017-05-07 ENCOUNTER — Other Ambulatory Visit: Payer: Self-pay | Admitting: Family Medicine

## 2017-06-10 ENCOUNTER — Other Ambulatory Visit: Payer: Self-pay

## 2017-06-10 MED ORDER — ALPRAZOLAM 0.25 MG PO TABS: ORAL_TABLET | ORAL | 5 refills | Status: DC

## 2017-06-22 ENCOUNTER — Other Ambulatory Visit: Payer: Self-pay | Admitting: Family Medicine

## 2017-06-24 ENCOUNTER — Other Ambulatory Visit: Payer: Self-pay | Admitting: Family Medicine

## 2017-06-26 ENCOUNTER — Ambulatory Visit: Payer: Medicare Other | Admitting: Family Medicine

## 2017-07-11 ENCOUNTER — Other Ambulatory Visit: Payer: Self-pay

## 2017-07-11 MED ORDER — CO Q10 100 MG PO CAPS: 1.0000 | ORAL_CAPSULE | Freq: Every day | ORAL | 12 refills | Status: DC

## 2017-07-18 ENCOUNTER — Other Ambulatory Visit: Payer: Self-pay | Admitting: Family Medicine

## 2017-07-26 ENCOUNTER — Other Ambulatory Visit: Payer: Self-pay | Admitting: Family Medicine

## 2017-07-29 ENCOUNTER — Other Ambulatory Visit: Payer: Self-pay | Admitting: Family Medicine

## 2017-08-01 ENCOUNTER — Ambulatory Visit: Payer: Medicare Other | Admitting: Podiatry

## 2017-09-21 ENCOUNTER — Other Ambulatory Visit: Payer: Self-pay | Admitting: Family Medicine

## 2017-09-23 ENCOUNTER — Other Ambulatory Visit: Payer: Self-pay | Admitting: Family Medicine

## 2017-10-07 ENCOUNTER — Other Ambulatory Visit: Payer: Self-pay | Admitting: Family Medicine

## 2017-10-28 ENCOUNTER — Other Ambulatory Visit: Payer: Self-pay | Admitting: Emergency Medicine

## 2017-10-28 DIAGNOSIS — R410 Disorientation, unspecified: Secondary | ICD-10-CM

## 2017-10-28 NOTE — Addendum Note (Signed)
Addended by: Latanya Presser'DELL, BRITTANY M on: 10/28/2017 04:41 PM   Modules accepted: Orders

## 2017-10-29 LAB — URINALYSIS, ROUTINE W REFLEX MICROSCOPIC
Bilirubin, UA: NEGATIVE
GLUCOSE, UA: NEGATIVE
Leukocytes, UA: NEGATIVE
NITRITE UA: NEGATIVE
Protein, UA: NEGATIVE
RBC, UA: NEGATIVE
Specific Gravity, UA: 1.023 (ref 1.005–1.030)
UUROB: 0.2 mg/dL (ref 0.2–1.0)
pH, UA: 5 (ref 5.0–7.5)

## 2017-10-30 LAB — URINE CULTURE: Organism ID, Bacteria: NO GROWTH

## 2017-11-01 ENCOUNTER — Telehealth: Payer: Self-pay | Admitting: Family Medicine

## 2017-11-08 ENCOUNTER — Telehealth: Payer: Self-pay | Admitting: Family Medicine

## 2017-11-08 NOTE — Telephone Encounter (Signed)
Please review. Thanks!  

## 2017-11-08 NOTE — Telephone Encounter (Signed)
Lynn [name] with [facility] stated she faxed a request on 10/22/17 advising that pt has been yelling at staff members and not acting like herself and requesting orders for pt's Xanax. Lynn [name] stated they received the order to conduct urine analysis but didn't see a response to the Xanax order request. Lynn [name] is requesting orders to advise how pt should be taking Xanax to help with pt's anxiety that might be while pt isn't acting herself. Lynn [name] advised that she would fax another order request on Monday. Per Lynn [name]'s request, pt's urine culture lab results from 10/28/17 were faxed to Fax#[phone]. Please advise. Thanks TNP

## 2017-11-12 ENCOUNTER — Other Ambulatory Visit: Payer: Self-pay | Admitting: Family Medicine

## 2017-11-12 NOTE — Telephone Encounter (Signed)
Done  ED 

## 2017-11-12 NOTE — Telephone Encounter (Signed)
Try Abilify at lowest dose--may be 2 mg--most likely  at night.

## 2017-11-26 ENCOUNTER — Telehealth: Payer: Self-pay | Admitting: Family Medicine

## 2017-11-26 NOTE — Telephone Encounter (Signed)
Ok to refill 

## 2017-11-26 NOTE — Telephone Encounter (Signed)
Linda with [facility] stated they have been faxing orders to our office since Thursday 11/21/17 and haven't received a response. Bonita Quin stated pt out of both  1. Colace 2. donepezil (ARICEPT) 10 MG tablet   Bonita Quin is requesting Rx for both medications to be faxed to [facility] Fax#[phone] and they will fax them to the pharmacy.  3. Bonita Quin stated that pt has been without both medications for about a week now and pt has had rectal bleeding. Bonita Quin thinks the bleeding is because pt has been without Colace. Please advise. Thanks TNP

## 2017-11-26 NOTE — Telephone Encounter (Signed)
Please review

## 2017-11-27 ENCOUNTER — Other Ambulatory Visit: Payer: Self-pay

## 2017-11-27 MED ORDER — DONEPEZIL HCL 10 MG PO TABS: 10.0000 mg | ORAL_TABLET | Freq: Every day | ORAL | 12 refills | Status: AC

## 2017-11-27 MED ORDER — DOCUSATE SODIUM 100 MG PO CAPS: 100.0000 mg | ORAL_CAPSULE | Freq: Two times a day (BID) | ORAL | 0 refills | Status: DC

## 2017-11-27 NOTE — Telephone Encounter (Signed)
Printed, Dr Sullivan Lone to sign and then to be faxed

## 2017-12-25 ENCOUNTER — Ambulatory Visit: Payer: Medicare Other

## 2017-12-25 ENCOUNTER — Encounter: Payer: Medicare Other | Admitting: Family Medicine

## 2017-12-25 ENCOUNTER — Other Ambulatory Visit: Payer: Self-pay

## 2017-12-25 DIAGNOSIS — N390 Urinary tract infection, site not specified: Secondary | ICD-10-CM

## 2017-12-25 DIAGNOSIS — R410 Disorientation, unspecified: Secondary | ICD-10-CM

## 2017-12-25 NOTE — Progress Notes (Signed)
Urine was dropped off to the office. Ok to send for urine cx per Dr. Sullivan LoneGilbert.

## 2017-12-27 LAB — URINE CULTURE

## 2018-01-06 ENCOUNTER — Telehealth: Payer: Self-pay

## 2018-01-06 NOTE — Telephone Encounter (Signed)
Tried to call # on file to r/s missed AWV. NANM. Will try back later. -MM

## 2018-01-07 ENCOUNTER — Other Ambulatory Visit: Payer: Self-pay | Admitting: Family Medicine

## 2018-01-08 NOTE — Telephone Encounter (Signed)
Spoke to son (ok per HIPAA) and he stated pt is going to start having visit with a physician at [facility]. AWV declined for the future. -MM

## 2018-02-04 ENCOUNTER — Emergency Department: Payer: Medicare Other

## 2018-02-04 ENCOUNTER — Emergency Department
Admission: EM | Admit: 2018-02-04 | Discharge: 2018-02-04 | Disposition: A | Discharge status: Home / Self Care | Payer: Medicare Other | Attending: Emergency Medicine | Admitting: Emergency Medicine

## 2018-02-04 ENCOUNTER — Other Ambulatory Visit: Payer: Self-pay

## 2018-02-04 DIAGNOSIS — Z043 Encounter for examination and observation following other accident: Secondary | ICD-10-CM | POA: Diagnosis not present

## 2018-02-04 DIAGNOSIS — F329 Major depressive disorder, single episode, unspecified: Secondary | ICD-10-CM | POA: Insufficient documentation

## 2018-02-04 DIAGNOSIS — Y92122 Bedroom in nursing home as the place of occurrence of the external cause: Secondary | ICD-10-CM | POA: Insufficient documentation

## 2018-02-04 DIAGNOSIS — G309 Alzheimer's disease, unspecified: Secondary | ICD-10-CM | POA: Diagnosis not present

## 2018-02-04 DIAGNOSIS — Z79899 Other long term (current) drug therapy: Secondary | ICD-10-CM | POA: Insufficient documentation

## 2018-02-04 DIAGNOSIS — M79671 Pain in right foot: Secondary | ICD-10-CM | POA: Insufficient documentation

## 2018-02-04 DIAGNOSIS — W19XXXA Unspecified fall, initial encounter: Secondary | ICD-10-CM

## 2018-02-04 DIAGNOSIS — F0280 Dementia in other diseases classified elsewhere without behavioral disturbance: Secondary | ICD-10-CM | POA: Diagnosis not present

## 2018-02-04 HISTORY — DX: Pure hypercholesterolemia, unspecified: E78.00

## 2018-02-04 HISTORY — DX: Depression, unspecified: F32.A

## 2018-02-04 HISTORY — DX: Gastro-esophageal reflux disease with esophagitis, without bleeding: K21.00

## 2018-02-04 HISTORY — DX: Major depressive disorder, single episode, unspecified: F32.9

## 2018-02-04 HISTORY — DX: Gastro-esophageal reflux disease with esophagitis: K21.0

## 2018-02-04 HISTORY — DX: Unspecified dementia, unspecified severity, without behavioral disturbance, psychotic disturbance, mood disturbance, and anxiety: F03.90

## 2018-02-04 LAB — CBC WITH DIFFERENTIAL/PLATELET
Basophils Absolute: 0.1 10*3/uL (ref 0–0.1)
Basophils Relative: 1 %
EOS ABS: 0 10*3/uL (ref 0–0.7)
EOS PCT: 1 %
HCT: 39.5 % (ref 35.0–47.0)
Hemoglobin: 13.5 g/dL (ref 12.0–16.0)
LYMPHS ABS: 2.1 10*3/uL (ref 1.0–3.6)
LYMPHS PCT: 28 %
MCH: 31.9 pg (ref 26.0–34.0)
MCHC: 34.2 g/dL (ref 32.0–36.0)
MCV: 93.5 fL (ref 80.0–100.0)
MONO ABS: 0.6 10*3/uL (ref 0.2–0.9)
MONOS PCT: 7 %
Neutro Abs: 4.7 10*3/uL (ref 1.4–6.5)
Neutrophils Relative %: 63 %
PLATELETS: 270 10*3/uL (ref 150–440)
RBC: 4.23 MIL/uL (ref 3.80–5.20)
RDW: 13.3 % (ref 11.5–14.5)
WBC: 7.5 10*3/uL (ref 3.6–11.0)

## 2018-02-04 LAB — COMPREHENSIVE METABOLIC PANEL
ALT: 14 U/L (ref 0–44)
ANION GAP: 6 (ref 5–15)
AST: 22 U/L (ref 15–41)
Albumin: 4 g/dL (ref 3.5–5.0)
Alkaline Phosphatase: 74 U/L (ref 38–126)
BILIRUBIN TOTAL: 0.8 mg/dL (ref 0.3–1.2)
BUN: 12 mg/dL (ref 8–23)
CALCIUM: 9.2 mg/dL (ref 8.9–10.3)
CO2: 28 mmol/L (ref 22–32)
Chloride: 109 mmol/L (ref 98–111)
Creatinine, Ser: 0.66 mg/dL (ref 0.44–1.00)
GFR calc Af Amer: >60 mL/min (ref >60)
GFR calc non Af Amer: >60 mL/min (ref >60)
Glucose, Bld: 108 mg/dL — ABNORMAL HIGH (ref 70–99)
Potassium: 4 mmol/L (ref 3.5–5.1)
Sodium: 143 mmol/L (ref 135–145)
Total Protein: 6.8 g/dL (ref 6.5–8.1)

## 2018-02-04 LAB — URINALYSIS, ROUTINE W REFLEX MICROSCOPIC
BILIRUBIN URINE: NEGATIVE
Glucose, UA: NEGATIVE mg/dL
HGB URINE DIPSTICK: NEGATIVE
Ketones, ur: NEGATIVE mg/dL
Leukocytes, UA: NEGATIVE
Nitrite: NEGATIVE
PH: 7 (ref 5.0–8.0)
Protein, ur: NEGATIVE mg/dL
SPECIFIC GRAVITY, URINE: 1.01 (ref 1.005–1.030)

## 2018-02-04 LAB — TROPONIN I

## 2018-02-04 LAB — CK: Total CK: 42 U/L (ref 38–234)

## 2018-02-04 NOTE — ED Notes (Signed)
[facility]  CALLED  FOR  TRANSPORT 

## 2018-02-04 NOTE — ED Notes (Signed)
[facility]  CALLED  FOR  TRANSPORT  INFORMED  RN Rosey BathERESA

## 2018-02-04 NOTE — ED Notes (Signed)
Secretary notified to call ems for transport back to [facility] 

## 2018-02-04 NOTE — ED Triage Notes (Signed)
[facility] - staff reports that pt was found in the floor this morning - no injuries noted by staff or ems - pt denies pain

## 2018-02-04 NOTE — ED Provider Notes (Signed)
[facility] Emergency Department Provider Note ____________________________________________   First MD Initiated Contact with Patient 02/04/18 0732     (approximate)  I have reviewed the triage vital signs and the nursing notes.   HISTORY  Chief Complaint Fall  HPI Abegail A Budde is a 84 y.o. female with a history of dementia who is her baseline is alert and oriented x1 who is presenting after being found down on morning rounds today.  The patient has no complaints.  Denies any pain.  No focal weakness reported.  EMS reported the staff that the patient was at her baseline.  No evidence of trauma.  Past Medical History:  Diagnosis Date  . Dementia   . Depression   . High cholesterol   . Macular degeneration   . Reflux esophagitis     Patient Active Problem List   Diagnosis Date Noted  . Alzheimer's dementia 02/11/2016  . Cognitive disorder 01/01/2015  . Arthritis 01/01/2015  . Back pain, chronic 01/01/2015  . Bladder cystocele 01/01/2015  . Clinical depression 01/01/2015  . Acid reflux 01/01/2015  . Glaucoma 01/01/2015  . Hypercholesteremia 01/01/2015  . 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 Degeneration macular 01/01/2015  . Osteopenia 01/01/2015  . Need for prophylactic hormone replacement therapy (postmenopausal) 01/01/2015  . 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 Scoliosis 01/01/2015  . Closed fracture of neck of left femur with routine healing 11/20/2014  . Hip fracture requiring operative repair [facility]) 11/20/2014    Past Surgical History:  Procedure Laterality Date  . CARPAL TUNNEL RELEASE Bilateral   . EYE SURGERY    . HERNIA REPAIR     inguinal  . HIP SURGERY    . VEIN LIGATION AND STRIPPING      Prior to Admission medications   Medication Sig Start Date End Date Taking? Authorizing Provider  acetaminophen (ACETAMINOPHEN EXTRA STRENGTH) 500 MG tablet Take 1 tablet (500 mg total) by mouth every 8 (eight) hours as needed. 10/07/17   Maple Hudson., MD  acetaminophen (TYLENOL) 500 MG tablet Take by mouth.    [provider]  ALPRAZolam Prudy Feeler) 0.25 MG tablet TAKE 1 TABLET BY MOUTH TWICE DAILY (8 AMAND 5 PM) FOR AGITATION 11/12/17   Maple Hudson., MD  brinzolamide (AZOPT) 1 % ophthalmic suspension Place 1 drop into both eyes 2 (two) times daily.     [provider]  Calcium Carb-Cholecalciferol 600-800 MG-UNIT CHEW Chew by mouth. 05/11/10   [provider]  Calcium Carbonate-Vitamin D 600-400 MG-UNIT tablet TAKE 1 TABLET BY MOUTH ONCE DAILY FOR SUPPLEMENT 09/21/17   Maple Hudson., MD  cholecalciferol (VITAMIN D) 1000 units tablet TAKE 1 TABLET BY MOUTH ONCE DAILY FOR SUPPLEMENT 01/07/18   Maple Hudson., MD  Cholecalciferol (VITAMIN D3) 1000 UNITS CAPS Take by mouth. 11/11/12   [provider]  Coenzyme Q10 (CO Q10) 100 MG CAPS Take 1 capsule by mouth daily. 07/11/17   Maple Hudson., MD  docusate sodium (COLACE) 100 MG capsule Take 1 capsule (100 mg total) by mouth 2 (two) times daily. 11/27/17   Maple Hudson., MD  donepezil (ARICEPT) 10 MG tablet Take 1 tablet (10 mg total) by mouth daily. 11/27/17   Maple Hudson., MD  [facility] NATURAL FIBER 0.52 g capsule TAKE 2 CAPSULES BY MOUTH EACH MORNING FOR CONSTIPATION 07/30/17   Maple Hudson., MD  MAPAP 500 MG tablet TAKE 2 TABLETS (1000 MG) BY MOUTH 3 TIMES  DAILY FOR PAIN OR DISCOMFORT 12/13/16   Malva LimesFisher, Donald E, MD  meclizine (ANTIVERT) 25 MG tablet Take 25 mg by mouth every 8 (eight) hours as needed for dizziness.    [provider]  mirtazapine (REMERON) 30 MG tablet TAKE 1 TABLET BY MOUTH ONCE DAILY AT BEDTIME FOR SLEEP OR AGITATION Patient not taking: Reported on 12/26/2016 10/10/15   Maple HudsonGilbert, Richard L Jr., MD  Multiple Vitamin (MULTI-VITAMINS) TABS TAKE 1 TABLET BY MOUTH ONCE DAILY FOR SUPPLEMENT. 07/26/17   Maple HudsonGilbert, Richard L Jr., MD  Multiple Vitamins-Minerals (PRESERVISION AREDS 2) CAPS Take by  mouth. 11/11/12   [provider]  omeprazole (PRILOSEC) 20 MG capsule TAKE 1 CAPSULE BY MOUTH ONCE EVERY OTHERDAY FOR REFLUX 09/23/17   Maple HudsonGilbert, Richard L Jr., MD  polyvinyl alcohol (LIQUIFILM TEARS) 1.4 % ophthalmic solution Place 1 drop into both eyes 2 (two) times daily.    [provider]  sertraline (ZOLOFT) 50 MG tablet Take by mouth. 05/10/14   [provider]    Allergies Sulfa antibiotics  Family History  Problem Relation Age of Onset  . Alzheimer's disease Mother   . Pneumonia Father   . Diabetes Father   . Stroke Father   . CVA Father   . Blindness Brother        legally blind  . Hepatitis C Son     Social History Social History   Tobacco Use  . Smoking status: Never Smoker  . Smokeless tobacco: Never Used  Substance Use Topics  . Alcohol use: No  . Drug use: No    Review of Systems  Constitutional: No fever/chills Eyes: No visual changes. ENT: No sore throat. Cardiovascular: Denies chest pain. Respiratory: Denies shortness of breath. Gastrointestinal: No abdominal pain.  No nausea, no vomiting.  No diarrhea.  No constipation. Genitourinary: Negative for dysuria. Musculoskeletal: Negative for back pain. Skin: Negative for rash. Neurological: Negative for headaches, focal weakness or numbness.   ____________________________________________   PHYSICAL EXAM:  VITAL SIGNS: ED Triage Vitals  Enc Vitals Group     BP 02/04/18 0727 (!) 165/84     Pulse Rate 02/04/18 0727 65     Resp 02/04/18 0727 15     Temp 02/04/18 0727 98.2 F (36.8 C)     Temp Source 02/04/18 0727 Oral     SpO2 02/04/18 0723 98 %     Weight 02/04/18 0724 120 lb (54.4 kg)     Height 02/04/18 0724 5\' 5"  (1.651 m)     Head Circumference --      Peak Flow --      Pain Score 02/04/18 0723 0     Pain Loc --      Pain Edu? --      Excl. in GC? --     Constitutional: Alert and oriented x1. in no acute distress. Eyes: Conjunctivae are normal.  Head:  Atraumatic. Nose: No congestion/rhinnorhea. Mouth/Throat: Mucous membranes are moist.  Some maroon coffee-ground crusting to the corners of the mouth, bilaterally.  Minimal amount bilaterally. Neck: No stridor.  No tenderness to palpation to the cervical spine.  No deformity or step-off. Cardiovascular: Normal rate, regular rhythm. Grossly normal heart sounds.  No tenderness to palpation or crepitus along the chest wall. Respiratory: Normal respiratory effort.  No retractions. Lungs CTAB. Gastrointestinal: Soft and nontender. No distention. No CVA tenderness.  Digital rectal exam with grossly brown stool which is heme-negative. Musculoskeletal: No lower extremity tenderness nor edema.  No joint effusions.  Pelvis  is stable.  5 out of 5 strength in bilateral lower extremities.  However, there is mild tenderness to the medial and lateral malleolus of the right ankle without any deformity or swelling. Neurologic:  Normal speech and language. No gross focal neurologic deficits are appreciated. Skin:  Skin is warm, dry and intact. No rash noted. Psychiatric: Mood and affect are normal. Speech and behavior are normal.  ____________________________________________   LABS (all labs ordered are listed, but only abnormal results are displayed)  Labs Reviewed  COMPREHENSIVE METABOLIC PANEL - Abnormal; Notable for the following components:      Result Value   Glucose, Bld 108 (*)    All other components within normal limits  URINALYSIS, ROUTINE W REFLEX MICROSCOPIC - Abnormal; Notable for the following components:   Color, Urine YELLOW (*)    APPearance CLEAR (*)    All other components within normal limits  CBC WITH DIFFERENTIAL/PLATELET  TROPONIN I  CK   ____________________________________________  EKG  ED ECG REPORT I, Arelia Longest, the attending physician, personally viewed and interpreted this ECG.   Date: 02/04/2018  EKG Time: 0733  Rate: 63  Rhythm: normal sinus rhythm   Axis: Normal  Intervals:Incomplete left bundle branch block  ST&T Change: No ST segment elevation or depression.  No abnormal T wave inversion. No significant change from previous. ____________________________________________  RADIOLOGY  No acute intracranial abnormality.  Chest x-ray without acute disease.  No ankle fracture on the right ankle x-ray.  Bony resorption of the left femoral head and neck.  Orthopedic evaluation needed. ____________________________________________   PROCEDURES  Procedure(s) performed:   Procedures  Critical Care performed:   ____________________________________________   INITIAL IMPRESSION / ASSESSMENT AND PLAN / ED COURSE  Pertinent labs & imaging results that were available during my care of the patient were reviewed by me and considered in my medical decision making (see chart for details).  DDX: Mechanical fall, rhabdomyolysis, UTI, ACS, skull fracture, intercranial hemorrhage, electrolyte abnormality, anemia, GI bleeding As part of my medical decision making, I reviewed the following data within the electronic MEDICAL RECORD NUMBER Notes from prior ED visits  ----------------------------------------- 8:55 AM on 02/04/2018 -----------------------------------------  Patient with very reassuring lab work.  Negative rectal exam.  Family the bedside and says the patient has a long-standing history of left hip orthopedic pathology and they are aware of the changes found on the x-ray.  The patient's daughter also called the patient's skilled nursing facility and there was no evidence of the patient had vomited this morning.  Unclear of what the material is around the patient's mouth.  However, she did negative rectal exam.  No vomiting in the emergency department.  Reassuring lab work.  The patient has told the daughter that she slid out of her wheelchair which family says has happened in the past.  Family will discuss fall risk/precautions with skilled  nursing facility staff.  Patient to be discharged. ____________________________________________   FINAL CLINICAL IMPRESSION(S) / ED DIAGNOSES  Fall.  NEW MEDICATIONS STARTED DURING THIS VISIT:  New Prescriptions   No medications on file     Note:  This document was prepared using Dragon voice recognition software and may include unintentional dictation errors.     Myrna Blazer, MD 02/04/18 [id number]

## 2018-03-12 ENCOUNTER — Other Ambulatory Visit: Payer: Self-pay | Admitting: Family Medicine

## 2018-04-14 ENCOUNTER — Telehealth: Payer: Self-pay | Admitting: Family Medicine

## 2018-04-14 DIAGNOSIS — R627 Adult failure to thrive: Secondary | ICD-10-CM

## 2018-04-14 NOTE — Telephone Encounter (Signed)
Pt's son Lynn [name] stated pt is now stating at [facility] and they suggest pt be evaluated by Hospice. Lynn [name] stated he contacted Hospice and spoke with Mayo Ao advising Lynn [name] they would need a eval referral from Dr. Sullivan Lone. Lynn [name] is requesting the referral to Hospice. Please advise. Thanks TNP

## 2018-04-14 NOTE — Telephone Encounter (Signed)
Please review

## 2018-04-15 ENCOUNTER — Other Ambulatory Visit: Payer: Self-pay | Admitting: Family Medicine

## 2018-04-15 NOTE — Telephone Encounter (Signed)
Linda w/ [facility] is calling for pt's refills of:  ALPRAZolam (XANAX) 0.25 MG tablet MAPAP 500 MG tablet   Please fill at:  [facility] [place], [place] - [address] [phone] (Phone) [phone] (Fax)

## 2018-04-15 NOTE — Telephone Encounter (Signed)
ok 

## 2018-04-16 MED ORDER — ALPRAZOLAM 0.25 MG PO TABS: 0.2500 mg | ORAL_TABLET | Freq: Two times a day (BID) | ORAL | 2 refills | Status: AC | PRN

## 2018-04-16 MED ORDER — ACETAMINOPHEN 500 MG PO TABS
ORAL_TABLET | ORAL | 5 refills | Status: DC
Start: 2018-04-16 — End: 2018-10-17

## 2018-04-21 ENCOUNTER — Telehealth: Payer: Self-pay | Admitting: Family Medicine

## 2018-04-21 NOTE — Telephone Encounter (Signed)
This was signed this morning

## 2018-04-21 NOTE — Telephone Encounter (Signed)
Lynn [name], patient's son, has contacted Lynn [name] with [facility]osp[facility] to request services for his mother.      Lynn [name] needs a referral faxed to [phone] for either Palliative Care Assessment  or Hospice Assessment for [name].

## 2018-04-23 NOTE — Telephone Encounter (Signed)
Could you send me another copy.  I don't know what he is doing with all of his paperwork but he said he doesn't have it. Sorry, thanks [employer]

## 2018-04-23 NOTE — Telephone Encounter (Signed)
A referral form was faxed over to be signed and faxed back to me.I have not gotten it back to start referral process

## 2018-04-23 NOTE — Telephone Encounter (Signed)
Lynn [name] have you done the referral for Hospice? Thanks [employer]

## 2018-04-23 NOTE — Telephone Encounter (Signed)
Deborah with hospice called this morning 10-16 saying they have not received anything yet for Mrs. Vanhoose.  Deborah'a contact is  [phone]  Thanks teri

## 2018-04-24 NOTE — Telephone Encounter (Signed)
thanks

## 2018-04-24 NOTE — Telephone Encounter (Signed)
I just re faxed it.

## 2018-04-25 ENCOUNTER — Telehealth: Payer: Self-pay | Admitting: Family Medicine

## 2018-04-25 NOTE — Telephone Encounter (Signed)
[facility] called needing a verbal order to begin [facility] and they also need the most recent office notes faxed to [phone]  They were told he would be back in the office Monday.  She said she would let the family know.  Hospice contact is [phone]  [name]

## 2018-04-28 NOTE — Telephone Encounter (Signed)
Patient has not had office visit since 12/2016.  We were told by the patient's son that he was going to get the physician at [facility] to start seeing her.  I spoke with Angelique Blonder at [facility] and explained to her that we had not seen her and that we would not be able to provide a face to face note for an order.  She is going to get in touch with the son and see what he wants to do.

## 2018-05-07 ENCOUNTER — Ambulatory Visit: Payer: Medicare Other | Admitting: Family Medicine

## 2018-05-07 VITALS — BP 100/60 | HR 72 | Temp 98.3°F | Resp 16

## 2018-05-07 DIAGNOSIS — F329 Major depressive disorder, single episode, unspecified: Secondary | ICD-10-CM

## 2018-05-07 DIAGNOSIS — R627 Adult failure to thrive: Secondary | ICD-10-CM

## 2018-05-07 DIAGNOSIS — F0280 Dementia in other diseases classified elsewhere without behavioral disturbance: Secondary | ICD-10-CM

## 2018-05-07 DIAGNOSIS — G309 Alzheimer's disease, unspecified: Secondary | ICD-10-CM | POA: Diagnosis not present

## 2018-05-07 DIAGNOSIS — F32A Depression, unspecified: Secondary | ICD-10-CM

## 2018-05-07 NOTE — Progress Notes (Signed)
Lynn [name]  MRN: [id number] DOB: 06/18/1927  Subjective:  HPI   The patient is an 84 year old female who presents for face to face exam for referral to Hospice Care.  She is currently a patient at [facility].  She was last seen here on 12/24/16.  When attempts made in the past to get her an appointment there was confusion about who the patient was being followed by.  Her son had at one time stated that he was having the facility doctor see her but apparently this has not occurred.    Patient Active Problem List   Diagnosis Date Noted  . Alzheimer's dementia (HCC) 02/11/2016  . Cognitive disorder 01/01/2015  . Arthritis 01/01/2015  . Back pain, chronic 01/01/2015  . Bladder cystocele 01/01/2015  . Clinical depression 01/01/2015  . Acid reflux 01/01/2015  . Glaucoma 01/01/2015  . Hypercholesteremia 01/01/2015  . 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 Degeneration macular 01/01/2015  . Osteopenia 01/01/2015  . Need for prophylactic hormone replacement therapy (postmenopausal) 01/01/2015  . 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 Scoliosis 01/01/2015  . Closed fracture of neck of left femur with routine healing 11/20/2014  . Hip fracture requiring operative repair [facility]) 11/20/2014    Past Medical History:  Diagnosis Date  . Dementia   . Depression   . High cholesterol   . Macular degeneration   . Reflux esophagitis     Social History   Socioeconomic History  . Marital status: Widowed    Spouse name: Aeronautical engineer  . Number of children: 3  . Years of education: 70  . Highest education level: Not on file  Occupational History  . Occupation: retired  Engineer, production  . Financial resource strain: Not on file  . Food insecurity:    Worry: Not on file    Inability: Not on file  . Transportation needs:    Medical: Not on file    Non-medical: Not on file  Tobacco Use  . Smoking status: Never Smoker  . Smokeless tobacco: Never Used  Substance and Sexual Activity  . Alcohol use: No  .  Drug use: No  . Sexual activity: Never  Lifestyle  . Physical activity:    Days per week: Not on file    Minutes per session: Not on file  . Stress: Not on file  Relationships  . Social connections:    Talks on phone: Not on file    Gets together: Not on file    Attends religious service: Not on file    Active member of club or organization: Not on file    Attends meetings of clubs or organizations: Not on file    Relationship status: Not on file  . Intimate partner violence:    Fear of current or ex partner: Not on file    Emotionally abused: Not on file    Physically abused: Not on file    Forced sexual activity: Not on file  Other Topics Concern  . Not on file  Social History Narrative  . Not on file    Outpatient Encounter Medications as of 05/07/2018  Medication Sig Note  . acetaminophen (MAPAP) 500 MG tablet TAKE 2 TABLETS (1000 MG) BY MOUTH 3 TIMES DAILY FOR PAIN OR DISCOMFORT   . ALPRAZolam (XANAX) 0.25 MG tablet Take 1 tablet (0.25 mg total) by mouth 2 (two) times daily as needed for anxiety (or agitation).   . ARIPiprazole (ABILIFY) 2 MG tablet Take 2 mg by mouth daily.   Marland Kitchen  brinzolamide (AZOPT) 1 % ophthalmic suspension Place 1 drop into both eyes 2 (two) times daily.    . Calcium Carbonate-Vitamin D 600-400 MG-UNIT tablet TAKE 1 TABLET BY MOUTH ONCE DAILY FOR SUPPLEMENT   . cholecalciferol (VITAMIN D) 1000 units tablet TAKE 1 TABLET BY MOUTH ONCE DAILY FOR SUPPLEMENT   . Coenzyme Q10 (CO Q10) 100 MG CAPS Take 1 capsule by mouth daily.   Marland Kitchen docusate sodium (COLACE) 100 MG capsule Take 1 capsule (100 mg total) by mouth 2 (two) times daily.   Marland Kitchen donepezil (ARICEPT) 10 MG tablet Take 1 tablet (10 mg total) by mouth daily.   Marland Kitchen GNP NATURAL FIBER 0.52 g capsule TAKE 2 CAPSULES BY MOUTH EACH MORNING FOR CONSTIPATION   . Multiple Vitamin (MULTI-VITAMINS) TABS TAKE 1 TABLET BY MOUTH ONCE DAILY FOR SUPPLEMENT.   . Multiple Vitamins-Minerals (PRESERVISION AREDS 2) CAPS Take by  mouth. 01/01/2015: Received from: [facility]  . omeprazole (PRILOSEC) 20 MG capsule TAKE 1 CAPSULE BY MOUTH ONCE EVERY OTHERDAY FOR REFLUX   . polyvinyl alcohol (LIQUIFILM TEARS) 1.4 % ophthalmic solution Place 1 drop into both eyes 2 (two) times daily.   . meclizine (ANTIVERT) 25 MG tablet Take 25 mg by mouth every 8 (eight) hours as needed for dizziness.   . mirtazapine (REMERON) 30 MG tablet TAKE 1 TABLET BY MOUTH ONCE DAILY AT BEDTIME FOR SLEEP OR AGITATION (Patient not taking: Reported on 12/26/2016)   . sertraline (ZOLOFT) 50 MG tablet Take by mouth.   . [DISCONTINUED] acetaminophen (ACETAMINOPHEN EXTRA STRENGTH) 500 MG tablet Take 1 tablet (500 mg total) by mouth every 8 (eight) hours as needed.   . [DISCONTINUED] acetaminophen (TYLENOL) 500 MG tablet Take by mouth.   . [DISCONTINUED] Calcium Carb-Cholecalciferol 600-800 MG-UNIT CHEW Chew by mouth.   . [DISCONTINUED] Cholecalciferol (VITAMIN D3) 1000 UNITS CAPS Take by mouth. 01/01/2015: Received from: [facility]   No facility-administered encounter medications on file as of 05/07/2018.     Allergies  Allergen Reactions  . Sulfa Antibiotics Other (See Comments)    Reaction: Pt does not remember.    Review of Systems  Constitutional: Negative.   HENT: Negative.   Eyes: Negative.   Respiratory: Negative.   Cardiovascular: Negative.   Gastrointestinal: Negative.   Skin: Negative.   Neurological: Negative.   Endo/Heme/Allergies: Negative.   Psychiatric/Behavioral: Positive for memory loss.    Objective:  BP 100/60 (BP Location: Right Arm, Patient Position: Sitting, Cuff Size: Normal)   Pulse 72   Temp 98.3 F (36.8 C) (Oral)   Resp 16   SpO2 98%   Physical Exam  Constitutional: She is oriented to person, place, and time and well-developed, well-nourished, and in no distress.  Cachectic WF NAD.  HENT:  Head: Normocephalic and atraumatic.  Right Ear: External ear normal.  Left Ear:  External ear normal.  Nose: Nose normal.  Eyes: Conjunctivae are normal. No scleral icterus.  Neck: No thyromegaly present.  Cardiovascular: Normal rate, regular rhythm and normal heart sounds.  Pulmonary/Chest: Effort normal and breath sounds normal.  Abdominal: Soft.  Musculoskeletal: She exhibits no edema.  Neurological: She is alert and oriented to person, place, and time. Gait normal. GCS score is 15.  Skin: Skin is warm and dry.  Psychiatric: Mood and affect normal.    Assessment and Plan :   1. Alzheimer's dementia without behavioral disturbance, unspecified timing of dementia onset (HCC) Progressive. More than 50% of 25 minute visit spent in counseling/coordination of care. - CBC  with Differential/Platelet - Amb Referral to Palliative Care  2. Failure to thrive in adult Palliative care consult.pt confined to wheelchair. - TSH - Amb Referral to Palliative Care  3. Depression, unspecified depression type  - Comprehensive metabolic panel   HPI, Exam and A&P Transcribed under the direction and in the presence of Megan Mans., MD. Electronically Signed: Janey Greaser, RMA I have done the exam and reviewed the chart and it is accurate to the best of my knowledge. Dentist has been used and  any errors in dictation or transcription are unintentional. Julieanne Manson M.D. [facility]

## 2018-05-08 ENCOUNTER — Telehealth: Payer: Self-pay | Admitting: Family Medicine

## 2018-05-08 LAB — CBC WITH DIFFERENTIAL/PLATELET
Basophils Absolute: 0.1 10*3/uL (ref 0.0–0.2)
Basos: 1 %
EOS (ABSOLUTE): 0.2 10*3/uL (ref 0.0–0.4)
EOS: 2 %
HEMATOCRIT: 38.2 % (ref 34.0–46.6)
Hemoglobin: 12.7 g/dL (ref 11.1–15.9)
IMMATURE GRANS (ABS): 0 10*3/uL (ref 0.0–0.1)
IMMATURE GRANULOCYTES: 0 %
LYMPHS: 27 %
Lymphocytes Absolute: 2 10*3/uL (ref 0.7–3.1)
MCH: 29.6 pg (ref 26.6–33.0)
MCHC: 33.2 g/dL (ref 31.5–35.7)
MCV: 89 fL (ref 79–97)
MONOS ABS: 0.8 10*3/uL (ref 0.1–0.9)
Monocytes: 10 %
NEUTROS PCT: 60 %
Neutrophils Absolute: 4.6 10*3/uL (ref 1.4–7.0)
Platelets: 345 10*3/uL (ref 150–450)
RBC: 4.29 x10E6/uL (ref 3.77–5.28)
RDW: 12.6 % (ref 12.3–15.4)
WBC: 7.6 10*3/uL (ref 3.4–10.8)

## 2018-05-08 LAB — COMPREHENSIVE METABOLIC PANEL
ALT: 14 IU/L (ref 0–32)
AST: 15 IU/L (ref 0–40)
Albumin/Globulin Ratio: 1.2 (ref 1.2–2.2)
Albumin: 3.6 g/dL (ref 3.5–4.7)
Alkaline Phosphatase: 91 IU/L (ref 39–117)
BUN/Creatinine Ratio: 20 (ref 12–28)
BUN: 12 mg/dL (ref 8–27)
Bilirubin Total: 0.2 mg/dL (ref 0.0–1.2)
CALCIUM: 9.1 mg/dL (ref 8.7–10.3)
CO2: 24 mmol/L (ref 20–29)
Chloride: 105 mmol/L (ref 96–106)
Creatinine, Ser: 0.61 mg/dL (ref 0.57–1.00)
GFR calc Af Amer: 93 mL/min/{1.73_m2} (ref >59)
GFR, EST NON AFRICAN AMERICAN: 81 mL/min/{1.73_m2} (ref >59)
Globulin, Total: 2.9 g/dL (ref 1.5–4.5)
Glucose: 86 mg/dL (ref 65–99)
Potassium: 4.4 mmol/L (ref 3.5–5.2)
Sodium: 143 mmol/L (ref 134–144)
Total Protein: 6.5 g/dL (ref 6.0–8.5)

## 2018-05-08 LAB — TSH: TSH: 0.803 u[IU]/mL (ref 0.450–4.500)

## 2018-05-08 NOTE — Telephone Encounter (Signed)
Sorry, Dr Sullivan Lone saw her yesterday for the face to face and he said do Palliative care. Thanks

## 2018-05-08 NOTE — Telephone Encounter (Signed)
I have gotten an order for pt to be referred to hospice and another one for palliative care.Just need to know which I need to make referral for.I have faxed forms to be signed for both places

## 2018-05-09 ENCOUNTER — Other Ambulatory Visit: Payer: Self-pay | Admitting: Family Medicine

## 2018-05-09 ENCOUNTER — Telehealth: Payer: Self-pay

## 2018-05-09 NOTE — Telephone Encounter (Signed)
Pt's son Loraine Leriche (On the DPR.) advised.   Thanks,   -Vernona Rieger

## 2018-05-09 NOTE — Telephone Encounter (Signed)
-----   Message from Maple Hudson., MD sent at 05/08/2018 10:47 AM EDT ----- Labs ok. Pt with severe dementia.

## 2018-05-16 ENCOUNTER — Telehealth: Payer: Self-pay | Admitting: Family Medicine

## 2018-05-16 NOTE — Telephone Encounter (Signed)
Lynn [name] with Hospice called stating she received request for Hospice Consult.  However, a nurse went out Wednesday for a  Palliative care visit and thinks that she does not qualify for Hospice just yet.   The Palliative care notes were faxed to you and she said to please read those.     If you still want a Hospice consult, please let Neysa Bonito know and they will proceed.

## 2018-05-19 ENCOUNTER — Other Ambulatory Visit: Payer: Self-pay | Admitting: Family Medicine

## 2018-05-19 NOTE — Telephone Encounter (Signed)
Please review. Thanks!  

## 2018-05-20 ENCOUNTER — Other Ambulatory Visit: Payer: Self-pay

## 2018-06-24 ENCOUNTER — Non-Acute Institutional Stay: Payer: Medicare Other | Admitting: Nurse Practitioner

## 2018-06-24 ENCOUNTER — Encounter: Payer: Self-pay | Admitting: Nurse Practitioner

## 2018-06-24 VITALS — HR 78 | Temp 98.0°F | Resp 18 | Ht 61.0 in | Wt 125.0 lb

## 2018-06-24 DIAGNOSIS — R413 Other amnesia: Secondary | ICD-10-CM | POA: Insufficient documentation

## 2018-06-24 DIAGNOSIS — R630 Anorexia: Secondary | ICD-10-CM

## 2018-06-24 DIAGNOSIS — Z515 Encounter for palliative care: Secondary | ICD-10-CM | POA: Insufficient documentation

## 2018-06-24 NOTE — Progress Notes (Signed)
[facility] Telephone: [phone]([phone] Fax: [phone]  PATIENT NAME: Lynn [name] DOB: 08/06/1926 MRN: [id number]  PRIMARY CARE PROVIDER:   Maple HudsonGilbert, Richard L Jr., MD  REFERRING PROVIDER:  Dr Lynn [name]; [facility] RESPONSIBLE PARTY:   Lynn FilbertMark [name] [phone] son  ASSESSMENT:     I visited and observed Lynn [name]. We talked about purpose of palliative medicine visit, she smiled saying thank you. Asked if she was having symptoms of pain and she replied know. Asked what she had for breakfast and she was unable to recall. She did make eye contact but briefly and looked away. She was distracted during palliative medicine visit and difficult to keep on topic. She was operated with assessment. Limited verbal discussion due to cognitive impairment. DNR remains in place. Attempted to talk about Christmas though she was not engaging. Emotional support provided. I have attempted to call her son Lynn [name], message left for update on palliative care visit. No new changes to goals our current plan of care will continue to follow up with weight loss. I have updated staff.  RECOMMENDATIONS and PLAN:   1.Palliative care encounter Z51.5; Palliative medicine team will continue to support patient, patient's family, and medical team. Visit consisted of counseling and education dealing with the complex and emotionally intense issues of symptom management and palliative care in the setting of serious and potentially life-threatening illness  2. Anorexia R63.0. Support encourage to continue to go to the dining area for meals. Assistance as needed continues to encourage to feed self. Continue supplements, snacks and weights.  3. Memory loss R41.3 secondary to dementia. Continue with supportive measures as chronic disease remains Progressive.  I spent 45 minutes providing this consultation,  from 11:00am to 11:45am. More than 50% of the time in this consultation was spent coordinating communication.    HISTORY OF PRESENT ILLNESS:  Lynn [name] is a 84 y.o. year old female with multiple medical problems including Alzheimer's dementia, macular degeneration, reflux esophagitis, hyperlipidemia, history of bladder cystocele, chronic back pain, scoliosis, osteopenia, depression, history of vain stripping, hip surgery, hernia repair, bilateral carpal tunnel release, eye surgery. She continues to reside in locked memory care unit requiring assistance for transfers, ADL's, incontinence. She is able to feed herself with assistance. Appetite has been fair to poor depending on what's being served. She's had a slight weight loss over the last two months of 3 lbs. She is cognitively impaired though able to answer simple questions, difficulty with memory and processing. Staff endorses no recent Falls, wounds, infections, hospitalizations. DNR remains in place. Initial palliative care consult 11 / 6 / 2019 for goals of care to focus on comfort. At present she is sitting in the wheelchair in the activity room with a piece of paper and a crayon. She appears thin, confused but comfortable. No visitors present. Palliative Care was asked to help address goals of care.   CODE STATUS: DNR  PPS: 40% HOSPICE ELIGIBILITY/DIAGNOSIS: TBD  PAST MEDICAL HISTORY:  Past Medical History:  Diagnosis Date  . Dementia (HCC)   . Depression   . High cholesterol   . Macular degeneration   . Reflux esophagitis     SOCIAL HX:  Social History   Tobacco Use  . Smoking status: Never Smoker  . Smokeless tobacco: Never Used  Substance Use Topics  . Alcohol use: No    ALLERGIES:  Allergies  Allergen Reactions  . Sulfa Antibiotics Other (See Comments)    Reaction: Pt does not remember.  PERTINENT MEDICATIONS:  Outpatient Encounter Medications as of 06/24/2018  Medication Sig  . acetaminophen (MAPAP) 500 MG tablet TAKE 2 TABLETS (1000 MG) BY MOUTH 3 TIMES DAILY FOR PAIN OR DISCOMFORT  . ALPRAZolam (XANAX) 0.25 MG  tablet Take 1 tablet (0.25 mg total) by mouth 2 (two) times daily as needed for anxiety (or agitation).  . ARIPiprazole (ABILIFY) 2 MG tablet TAKE 1 TABLET BY MOUTH ONCE DAILY AT BEDTIME  . brinzolamide (AZOPT) 1 % ophthalmic suspension Place 1 drop into both eyes 2 (two) times daily.   . Calcium Carbonate-Vitamin D 600-400 MG-UNIT tablet TAKE 1 TABLET BY MOUTH ONCE DAILY FOR SUPPLEMENT  . cholecalciferol (VITAMIN D) 1000 units tablet TAKE 1 TABLET BY MOUTH ONCE DAILY FOR SUPPLEMENT  . Coenzyme Q10 (CO Q10) 100 MG CAPS Take 1 capsule by mouth daily.  Marland Kitchen docusate sodium (COLACE) 100 MG capsule TAKE 1 SOFTGEL BY MOUTH EVERY DAY TO SOFTEN STOOL  . donepezil (ARICEPT) 10 MG tablet Take 1 tablet (10 mg total) by mouth daily.  Marland Kitchen GNP NATURAL FIBER 0.52 g capsule TAKE 2 CAPSULES BY MOUTH EACH MORNING FOR CONSTIPATION  . meclizine (ANTIVERT) 25 MG tablet Take 25 mg by mouth every 8 (eight) hours as needed for dizziness.  . mirtazapine (REMERON) 30 MG tablet TAKE 1 TABLET BY MOUTH ONCE DAILY AT BEDTIME FOR SLEEP OR AGITATION (Patient not taking: Reported on 12/26/2016)  . Multiple Vitamin (MULTI-VITAMINS) TABS TAKE 1 TABLET BY MOUTH ONCE DAILY FOR SUPPLEMENT.  . Multiple Vitamins-Minerals (PRESERVISION AREDS 2) CAPS Take by mouth.  Marland Kitchen omeprazole (PRILOSEC) 20 MG capsule TAKE 1 CAPSULE BY MOUTH ONCE EVERY OTHERDAY FOR REFLUX  . polyvinyl alcohol (LIQUIFILM TEARS) 1.4 % ophthalmic solution Place 1 drop into both eyes 2 (two) times daily.  . sertraline (ZOLOFT) 50 MG tablet Take by mouth.   No facility-administered encounter medications on file as of 06/24/2018.     PHYSICAL EXAM:   General: NAD, frail appearing, thin Cardiovascular: regular rate and rhythm Pulmonary: clear ant fields Abdomen: soft, nontender, + bowel sounds GU: no suprapubic tenderness Extremities: no edema, no joint deformities Skin: no rashes Neurological: Weakness but otherwise nonfocal  Christin Prince Rome, NP

## 2018-06-26 ENCOUNTER — Telehealth: Payer: Self-pay | Admitting: Nurse Practitioner

## 2018-06-26 NOTE — Telephone Encounter (Signed)
I called Dellis FilbertMark Lindley, son and message left for update on palliative medicine visit.

## 2018-07-14 ENCOUNTER — Other Ambulatory Visit: Payer: Self-pay | Admitting: Family Medicine

## 2018-07-14 MED ORDER — CO Q10 100 MG PO CAPS: 1.0000 | ORAL_CAPSULE | Freq: Every day | ORAL | 12 refills | Status: AC

## 2018-07-14 NOTE — Telephone Encounter (Signed)
[facility] faxed refill request for the following medications:  Coenzyme Q10 (CO Q10) 100 MG CAPS  Qty: 30  Last dispensed:  06/06/2018  Date written: 07/11/2017  Please advise.

## 2018-08-04 ENCOUNTER — Other Ambulatory Visit: Payer: Self-pay | Admitting: Family Medicine

## 2018-08-05 ENCOUNTER — Non-Acute Institutional Stay: Payer: Medicare Other | Admitting: Nurse Practitioner

## 2018-08-05 ENCOUNTER — Encounter: Payer: Self-pay | Admitting: Nurse Practitioner

## 2018-08-05 VITALS — HR 80 | Resp 18 | Wt 132.0 lb

## 2018-08-05 DIAGNOSIS — R630 Anorexia: Secondary | ICD-10-CM

## 2018-08-05 DIAGNOSIS — R413 Other amnesia: Secondary | ICD-10-CM | POA: Insufficient documentation

## 2018-08-05 DIAGNOSIS — Z515 Encounter for palliative care: Secondary | ICD-10-CM

## 2018-08-05 NOTE — Progress Notes (Signed)
[facility] Telephone: [phone] Fax: [phone]) [phone]  PATIENT NAME: Lynn MarshallMaxine A [name] DOB: 03/05/1927 MRN: [id number]  PRIMARY CARE PROVIDER:   Maple HudsonGilbert, Richard L Jr., MD  REFERRING PROVIDER:  Dr Gilbert/[facility] RESPONSIBLE PARTY:   Dellis FilbertMark Lindley [phone] son  RECOMMENDATIONS and PLAN:  1.Palliative care encounter Z51.5; Palliative medicine team will continue to support patient, patient's family, and medical team. Visit consisted of counseling and education dealing with the complex and emotionally intense issues of symptom management and palliative care in the setting of serious and potentially life-threatening illness  2. Anorexia R63.0. Support encourage to continue to go to the dining area for meals. Assistance as needed continues to encourage to feed self. Continue supplements, snacks and weights.  3. Memory loss R41.3 secondary to dementia. Continue with supportive measures as chronic disease remains Progressive.  07/14/2018 weight 132.0 lb  ASSESSMENT:     I visited and observed Lynn [name]. We talked about how she was feeling today. We talked about symptoms of pain when she denies. Attempted to talk about her appetite and she shared it's not very good. Talked about what she was coloring. Limited verbal discussion due to cognitive impairment. She had difficulty following questions and answers. She was cooperative during assessment and expressed she was thankful for visit. Emotional support provided. Staff felt like she is slowly declining in the setting of dementia.  I called [name], Lynn [name] Son [facility] power-of-attorney. Talked about palliative care visit. Talked about visit with Lynn. [place]. We talked about progression of Dementia in the setting of natural aging. We talked about symptoms, appetite. We talked about her weight gain. [name] was very glad about the weight gain and shared that staff asked that family no longer brings in chocolate bars. We talked  about activities that she enjoys and she spends a lot of time in the activity room. He talked about the facility and care that she has been receiving what she is very pleased with. Medical goals to continue to focus on Comfort. DNR does remain in place. Discussed at present time she does appear to be stable and Mark in agreement. Talked about role of palliative care and plan of care. No changes to current goals at this time. We'll follow up in two months if needed or sooner should she decline. Mark in agreement. Updated staff  I spent 75 minutes providing this consultation, visit, assessment, chart review, phone call, interview multiple staff members  From 10:30am to 11:45am. More than 50% of the time in this consultation was spent coordinating communication.   HISTORY OF PRESENT ILLNESS:  Lynn [name] is a 85 y.o. year old female with multiple medical problems including Alzheimer's dementia, macular degeneration, reflux esophagitis, hyperlipidemia, history of bladder cystocele, chronic back pain, scoliosis, osteopenia, depression, history of vain stripping, hip surgery, hernia repair, bilateral carpal tunnel release, eye surgery.Continues to reside at locked memory care unit. She is a lift to the chair, ADL dependent with incontinence. She does get up every day and sit in the dining area, participating in activities such as coloring which she does frequently. She does feed herself and appetite has been poor per staff. No recent wounds, Falls, infections, hospitalizations. DNR remains in place. She is able to answer simple questions though confused and difficulty following and processing information. At present she is sitting in the wheelchair in the activity room coloring. She appears comfortable. No visitors present. Palliative Care was asked to help address goals of care.   CODE  STATUS: DNR  PPS: 40% HOSPICE ELIGIBILITY/DIAGNOSIS: TBD  PAST MEDICAL HISTORY:  Past Medical History:  Diagnosis Date   . Dementia (HCC)   . Depression   . High cholesterol   . Macular degeneration   . Reflux esophagitis     SOCIAL HX:  Social History   Tobacco Use  . Smoking status: Never Smoker  . Smokeless tobacco: Never Used  Substance Use Topics  . Alcohol use: No    ALLERGIES:  Allergies  Allergen Reactions  . Sulfa Antibiotics Other (See Comments)    Reaction: Pt does not remember.     PERTINENT MEDICATIONS:  Outpatient Encounter Medications as of 08/05/2018  Medication Sig  . acetaminophen (MAPAP) 500 MG tablet TAKE 2 TABLETS (1000 MG) BY MOUTH 3 TIMES DAILY FOR PAIN OR DISCOMFORT  . ALPRAZolam (XANAX) 0.25 MG tablet Take 1 tablet (0.25 mg total) by mouth 2 (two) times daily as needed for anxiety (or agitation).  . ARIPiprazole (ABILIFY) 2 MG tablet TAKE 1 TABLET BY MOUTH ONCE DAILY AT BEDTIME  . brinzolamide (AZOPT) 1 % ophthalmic suspension Place 1 drop into both eyes 2 (two) times daily.   . Calcium Carbonate-Vitamin D 600-400 MG-UNIT tablet TAKE 1 TABLET BY MOUTH ONCE DAILY FOR SUPPLEMENT  . cholecalciferol (VITAMIN D) 1000 units tablet TAKE 1 TABLET BY MOUTH ONCE DAILY FOR SUPPLEMENT  . Coenzyme Q10 (CO Q10) 100 MG CAPS Take 1 capsule by mouth daily.  Marland Kitchen docusate sodium (COLACE) 100 MG capsule TAKE 1 SOFTGEL BY MOUTH EVERY DAY TO SOFTEN STOOL  . donepezil (ARICEPT) 10 MG tablet Take 1 tablet (10 mg total) by mouth daily.  Marland Kitchen GNP NATURAL FIBER 0.52 g capsule TAKE 2 CAPSULES BY MOUTH EACH MORNING FOR CONSTIPATION  . meclizine (ANTIVERT) 25 MG tablet Take 25 mg by mouth every 8 (eight) hours as needed for dizziness.  . mirtazapine (REMERON) 30 MG tablet TAKE 1 TABLET BY MOUTH ONCE DAILY AT BEDTIME FOR SLEEP OR AGITATION (Patient not taking: Reported on 12/26/2016)  . Multiple Vitamin (MULTI-VITAMINS) TABS TAKE 1 TABLET BY MOUTH ONCE DAILY FOR SUPPLEMENT.  . Multiple Vitamins-Minerals (PRESERVISION AREDS 2) CAPS Take by mouth.  Marland Kitchen omeprazole (PRILOSEC) 20 MG capsule TAKE 1 CAPSULE BY MOUTH  ONCE EVERY OTHERDAY FOR REFLUX  . polyvinyl alcohol (LIQUIFILM TEARS) 1.4 % ophthalmic solution Place 1 drop into both eyes 2 (two) times daily.  . sertraline (ZOLOFT) 50 MG tablet Take by mouth.   No facility-administered encounter medications on file as of 08/05/2018.     PHYSICAL EXAM:   General: NAD, frail appearing, elderly, pleasantly confused female Cardiovascular: regular rate and rhythm Pulmonary: clear ant fields Abdomen: soft, nontender, + bowel sounds GU: no suprapubic tenderness Extremities: no edema, no joint deformities Skin: no rashes Neurological: Weakness but otherwise nonfocal/functional quadriplegic   Prince Rome, NP

## 2018-09-24 ENCOUNTER — Telehealth: Payer: Self-pay | Admitting: Family Medicine

## 2018-09-24 NOTE — Telephone Encounter (Signed)
Freddi Starr, RN  W/ [facility] [phone]  Wanting to verify if there is a Alztimer diagnosis for the pt.  Itis not specified on the current chart - unspecified dementia  Director asked Rusty to call to verify.  Please advise.  Thanks, [employer]

## 2018-09-24 NOTE — Telephone Encounter (Signed)
Advised [facility]) that patient has dementia diagnosis

## 2018-10-17 ENCOUNTER — Other Ambulatory Visit: Payer: Self-pay | Admitting: Family Medicine

## 2018-10-17 NOTE — Telephone Encounter (Signed)
Please Review

## 2018-11-03 ENCOUNTER — Ambulatory Visit: Admitting: Family Medicine

## 2018-11-18 ENCOUNTER — Telehealth: Payer: Self-pay

## 2018-11-21 ENCOUNTER — Telehealth: Payer: Self-pay | Admitting: *Deleted

## 2018-11-21 NOTE — Telephone Encounter (Signed)
[facility] called wanting the patient to be screened for COVID. They are screening all of the patient's in the facility. Ok to order? She is a [facility] patient. Please advise.  

## 2018-11-21 NOTE — Telephone Encounter (Signed)
Can you please write a rx for the testing so it can be faxed.  dbs

## 2018-11-21 NOTE — Telephone Encounter (Signed)
That's fine

## 2018-11-21 NOTE — Telephone Encounter (Signed)
[facility] called requesting an order be faxed today if possible for covid-19 testing. Please advise? Fax# [phone]. If fax does not go through someone will come by to pick up.  

## 2019-01-20 ENCOUNTER — Telehealth: Payer: Self-pay | Admitting: Family Medicine

## 2019-01-20 NOTE — Telephone Encounter (Signed)
Lynn [name] at [facility] called saying they sent a fax in regarding some medicaitons that Lynn [name] was taking that she is not able to take due to swallowing issues.  They need to know what to do.  They need someone to call them back   I ask her to refax   Lynn [name]

## 2019-01-20 NOTE — Telephone Encounter (Signed)
Fax in your stack to sign. Thanks!

## 2019-01-26 ENCOUNTER — Other Ambulatory Visit: Payer: Self-pay | Admitting: Family Medicine

## 2019-01-26 MED ORDER — OMEPRAZOLE 20 MG PO CPDR: DELAYED_RELEASE_CAPSULE | ORAL | 11 refills | Status: AC

## 2019-01-26 NOTE — Telephone Encounter (Signed)
[facility] faxed refill request for the following medications:  omeprazole (PRILOSEC) 20 MG capsule  Pharmacy noted that they don't have this medication as active for patient but that [facility] contacted them requesting refill. Pharmacy is requesting new Rx or a discontinue order for the medication. Please advise. Thanks TNP

## 2019-02-02 ENCOUNTER — Other Ambulatory Visit: Payer: Self-pay | Admitting: Family Medicine

## 2019-06-23 ENCOUNTER — Telehealth: Payer: Self-pay

## 2019-06-23 IMAGING — CR DG CHEST 1V
1 series · 1 of 1 positions shown · non-contrast
Comparison: 10/01/2014.

CLINICAL DATA: Unwitnessed fall.

EXAM:
CHEST  1 VIEW

[dg chest 1 view]
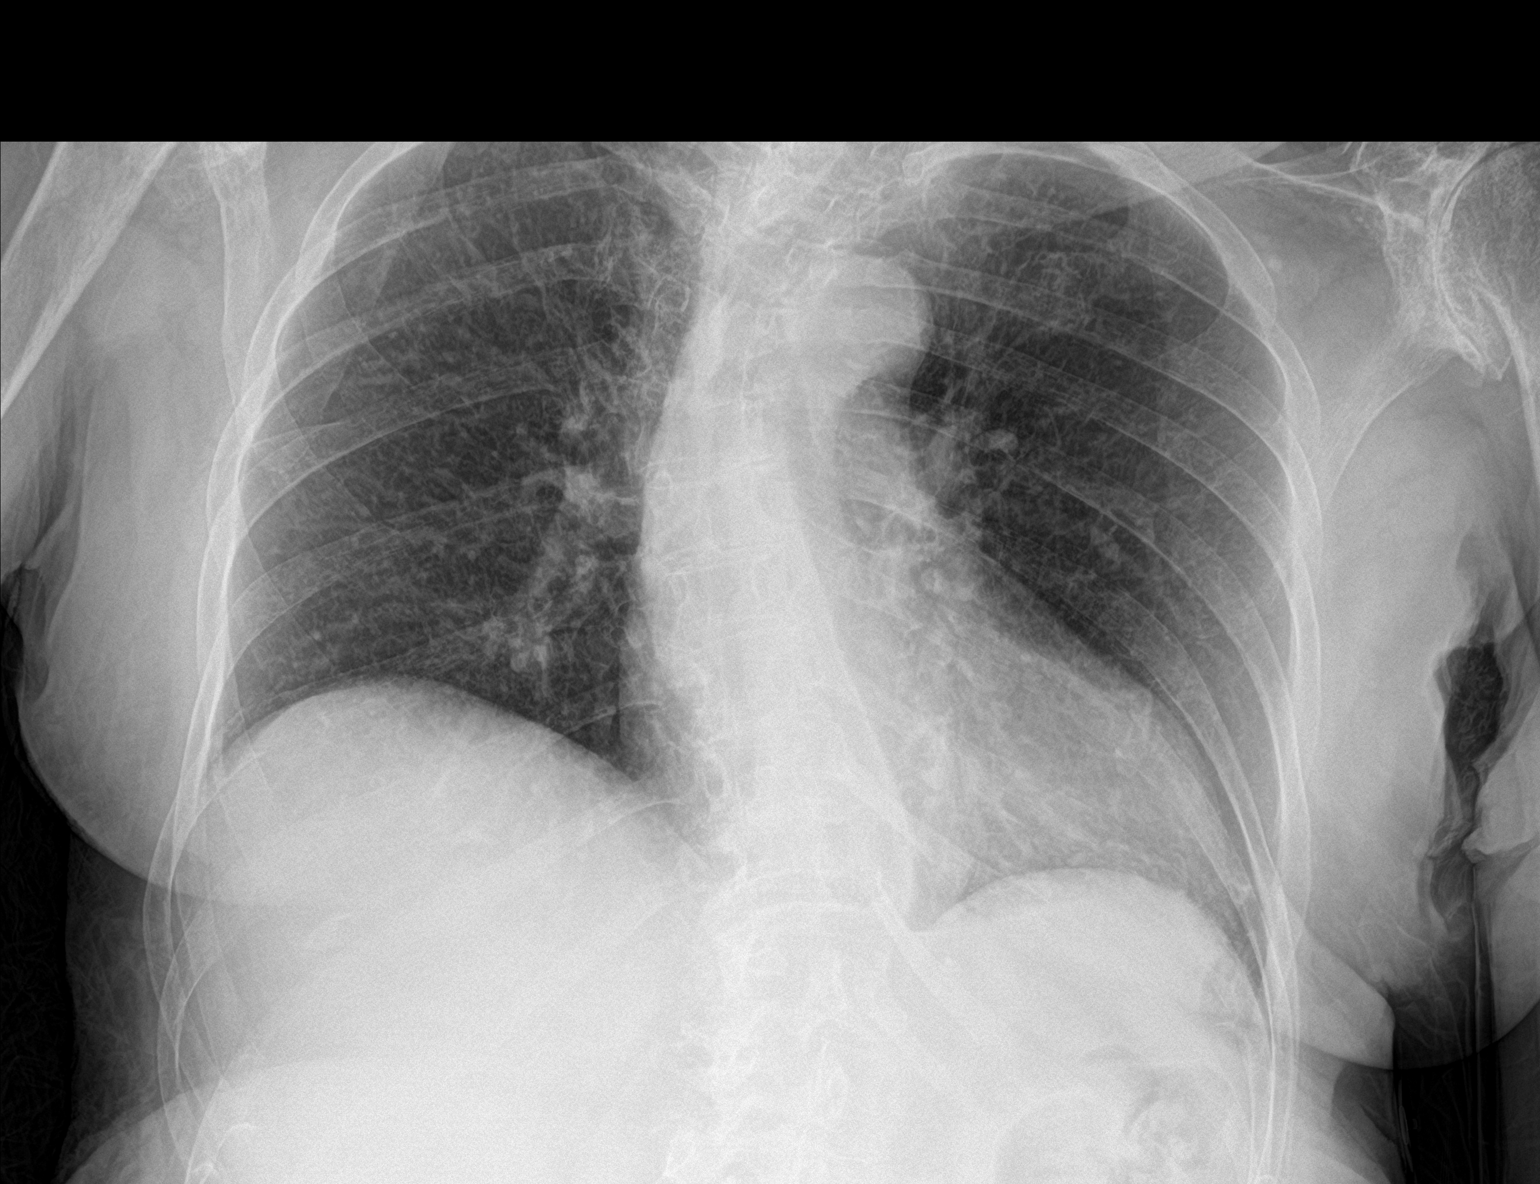

[1 of 1 positions shown; findings below may reference images not displayed]

FINDINGS: Mediastinum and hilar structures normal. Lungs are clear. No pleural
effusion or pneumothorax. Cardiomegaly with normal pulmonary
vascularity. Thoracolumbar spine scoliosis and degenerative change.
Degenerative changes both shoulders.
IMPRESSION: No acute cardiopulmonary disease.

## 2019-06-23 IMAGING — CR DG PELVIS 1-2V
1 series · 1 of 1 positions shown · non-contrast
Comparison: Fluoro spot images October 02, 2014

CLINICAL DATA: Patient was found down on the ground with
unwitnessed fall. Previous left hip fracture.

EXAM:
PELVIS - 1-2 VIEW

[dg pelvis 1-2 views]
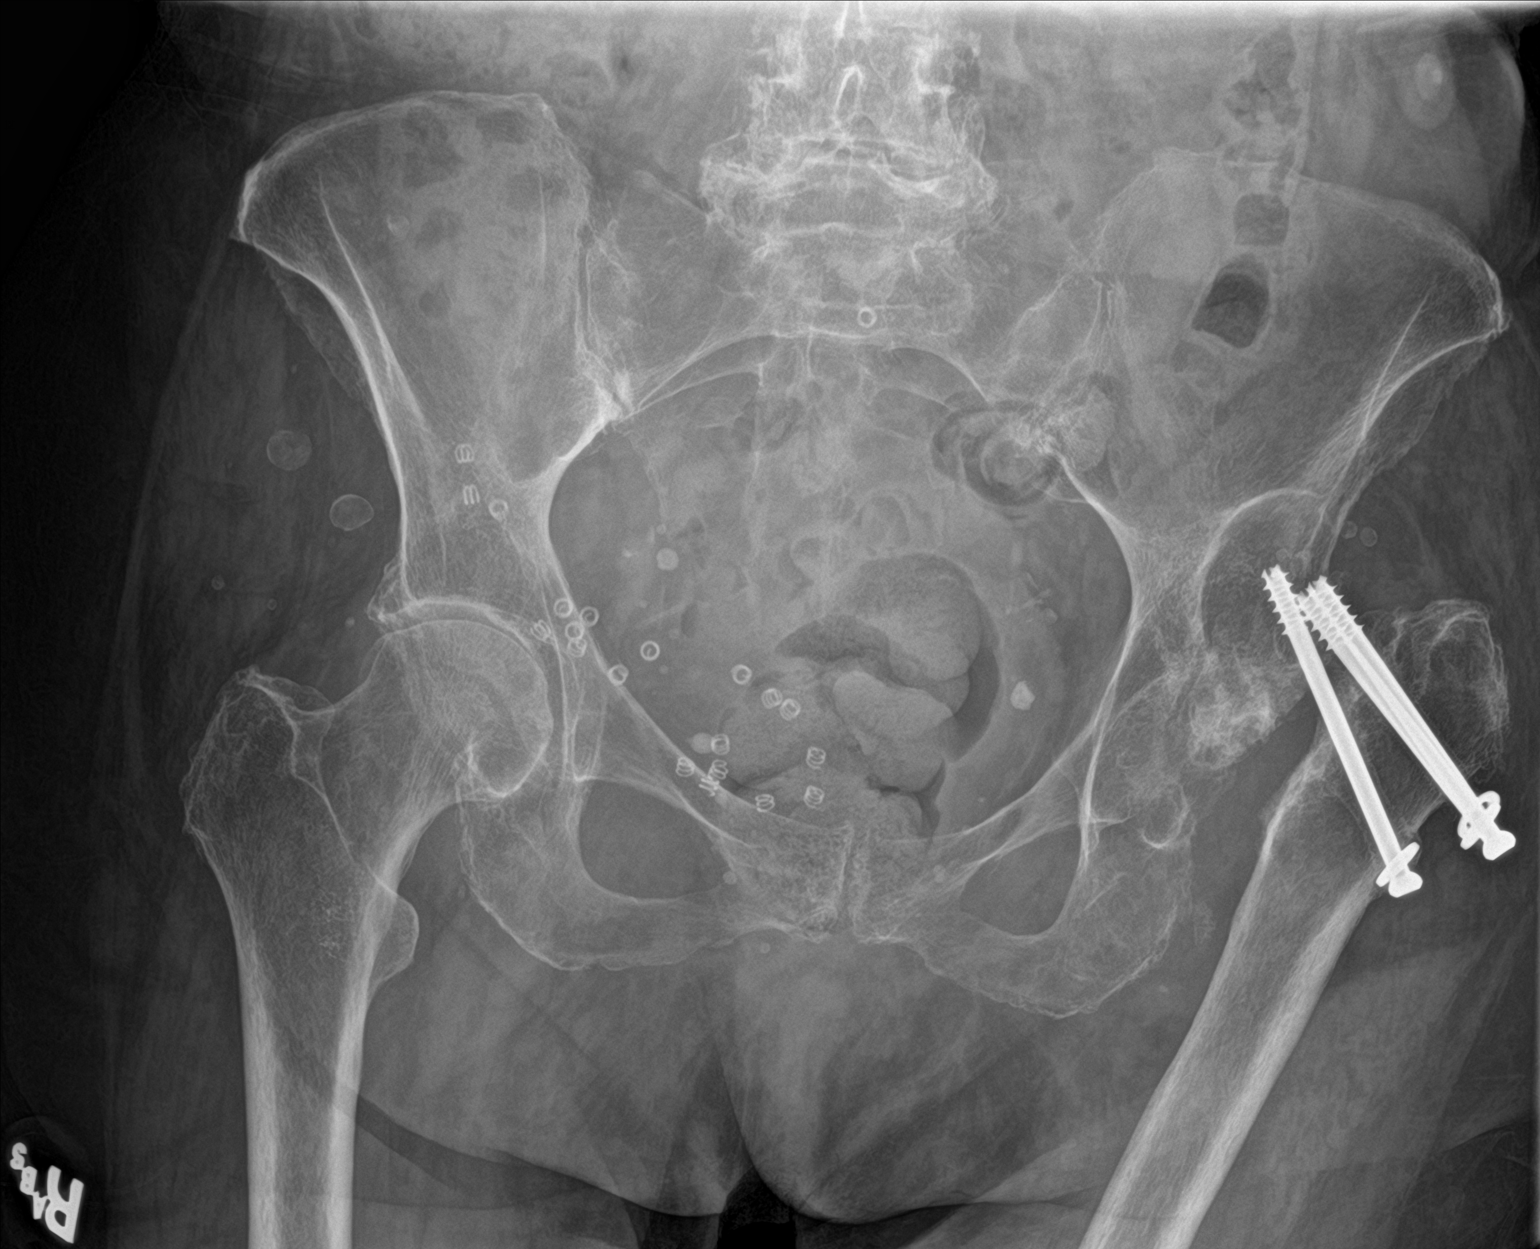

[1 of 1 positions shown; findings below may reference images not displayed]

FINDINGS: The bony pelvis is osteopenic. No acute pelvic fracture is observed.
There has been resorption of the left femoral head and neck. The
screws originally placed from the intertrochanteric region into the
now resorbed femoral head are intact. There appears to been chronic
migration of the femoral head with respect to the native acetabulum.
IMPRESSION: No definite acute fracture. There appears to have been bony
resorption of the left femoral head and neck since ORIF in Sunday September, 2014. There has apparently been superior migration of the femoral
head and with respect to the left hemipelvis such that a pseudo
acetabulum appears to have been formed above the native acetabulum.
Chronic dislocation of the now resorbed femoral head has likely been
present for some time. Orthopedic evaluation is needed.

## 2019-06-23 IMAGING — CR DG ANKLE COMPLETE 3+V*R*
1 series · 3 of 3 positions shown · non-contrast
Comparison: None.

CLINICAL DATA: Unwitnessed fall. The patient is unable to flex the
right foot.

EXAM:
RIGHT ANKLE - COMPLETE 3+ VIEW

[Series 1: dg ankle complete right · 0.14mm/px · 3 of 3 slices shown]
[im 1/3]
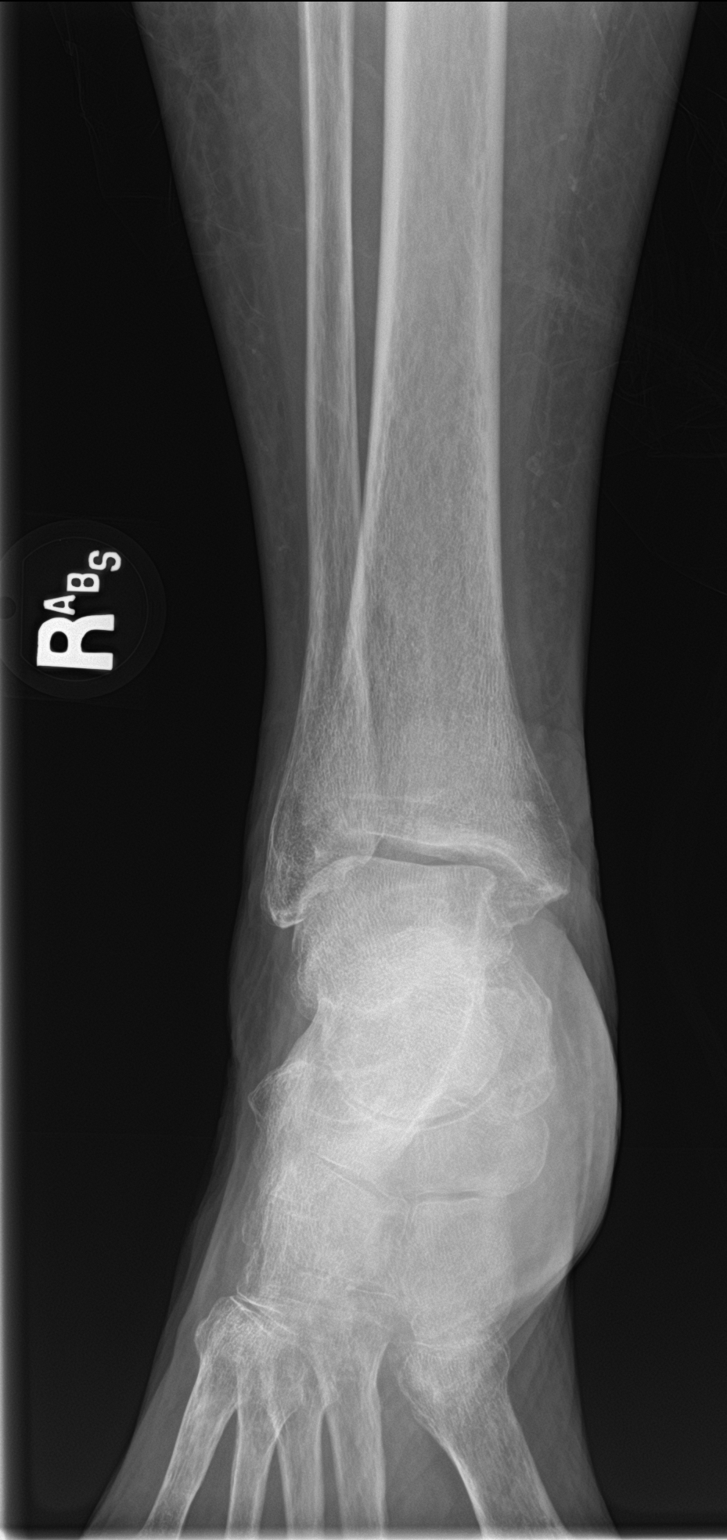
[im 2/3]
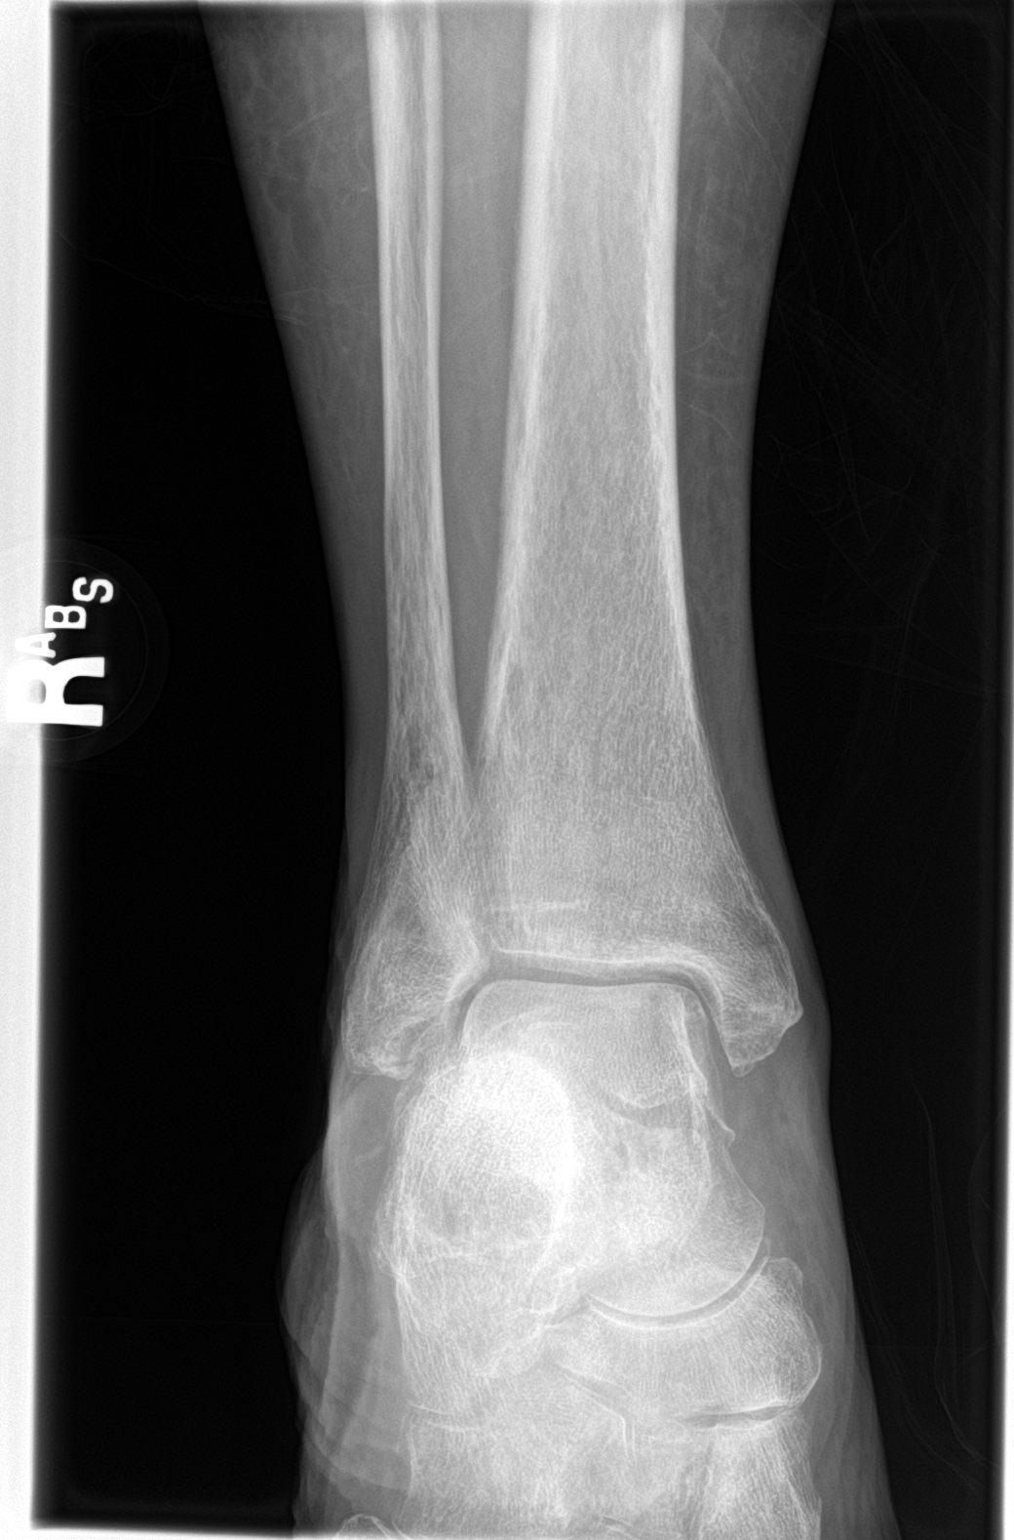
[im 3/3]
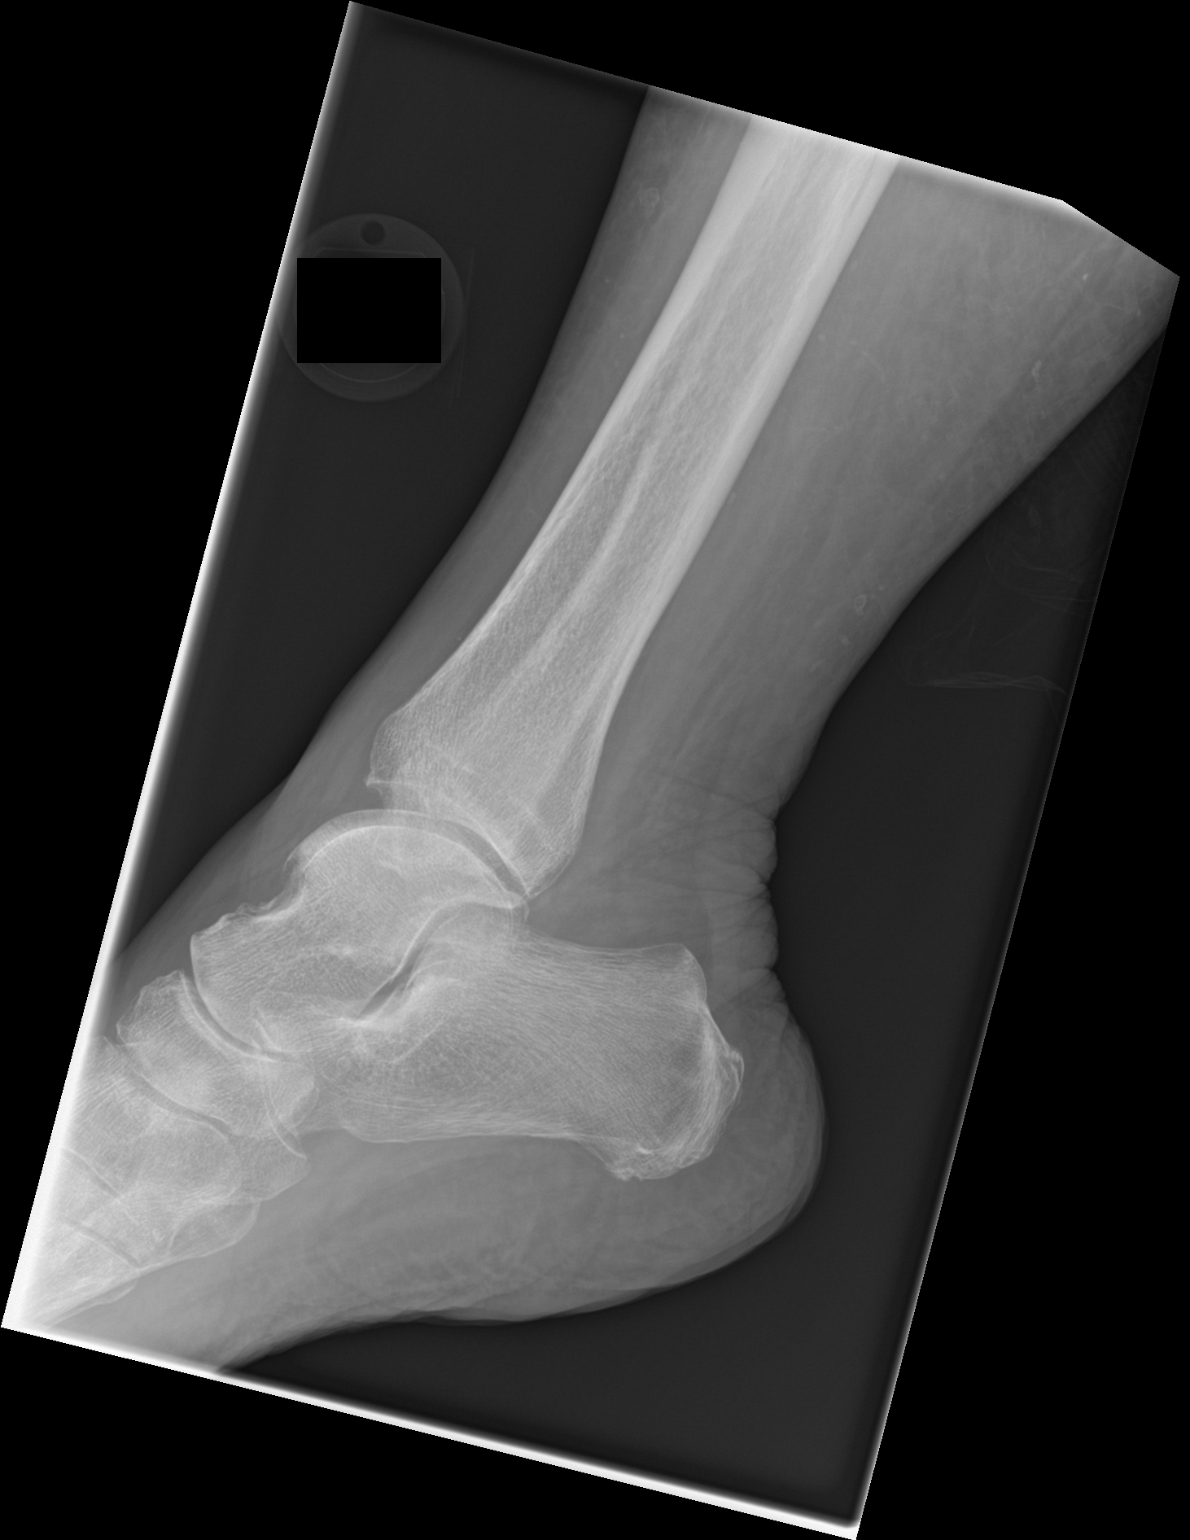

[3 of 3 positions shown; findings below may reference images not displayed]

FINDINGS: The bones are subjectively mildly osteopenic. The ankle joint
mortise is reasonably well maintained. The distal tibia and fibula
are intact. The talar dome and remainder of the talus as well as the
calcaneus appear normal. Where visualized the metatarsal bases are
intact.
IMPRESSION: No acute fracture of the right ankle. Mild soft tissue swelling
diffusely.

## 2019-06-23 NOTE — Telephone Encounter (Signed)
Copied from [place] [id number]. Topic: General - Inquiry >> Jun 23, 2019  9:30 AM Mathis Bud wrote: Reason for CRM: Patient daughter in law called having questions about the covid vaccine.  Call back [phone]

## 2019-07-01 NOTE — Telephone Encounter (Signed)
Sharyn Lull, this came in 8 days ago,they are calling again.  I can't tell that anything was done with it after it went to the nurse box.  Could you please check on this? Thanks

## 2019-07-01 NOTE — Telephone Encounter (Signed)
Patient's son is wanting to know what Dr. Alben Spittle recommendations are for patient getting the covid vaccine. Also Jeneen Rinks wanted to know if Dr. Rosanna Randy will give them a letter for patient's insurance stating patient when was diagnosed with Alzheimer's dementia. Patient is now in [facility]. Jeneen Rinks is aware Dr. Rosanna Randy is out of the office till Monday.

## 2019-07-01 NOTE — Telephone Encounter (Signed)
Patient's daughter in law calling again to discuss covid vaccine, specifically if patient should have it from nursing home when they receive it. Please advise.

## 2019-07-09 NOTE — Telephone Encounter (Signed)
Yes to covid vaccine--please check old chart--I think it has been about 10 years of alzheimers for this pt.

## 2019-07-11 ENCOUNTER — Other Ambulatory Visit: Payer: Self-pay | Admitting: Family Medicine

## 2019-07-15 NOTE — Telephone Encounter (Signed)
Pt daughter inlaw Marylene Land called and stated that she will need the note about the Alzheimer's Dementia to give to the IRS. The IRS is saying she owes at least 6000 and then penalties. Please advise.

## 2019-07-15 NOTE — Telephone Encounter (Signed)
Just an YI  I advised the daughter in law about the vaccine.  Also instructed her that since Ms Tays is now under the care of another physician and no longer our patient, they would need to get the insurance company to request any information they might need.  I did tell her you thought it was about 10 years.

## 2019-07-22 NOTE — Telephone Encounter (Signed)
Letter typed waiting to be signed

## 2019-07-22 NOTE — Telephone Encounter (Signed)
It looks like [employer] for 10 years--likely formal Dx of demential 2016.

## 2019-07-23 ENCOUNTER — Telehealth: Payer: Self-pay

## 2019-07-23 NOTE — Telephone Encounter (Signed)
The patient had a note for her dementia written and signed by dr.Gilbert. Her son Loraine Leriche [phone] was called to come pick up the letter but there was no answer and VM was full. The letter will be up front for patient to pick up.

## 2019-07-29 NOTE — Progress Notes (Signed)
Error

## 2019-10-12 ENCOUNTER — Telehealth: Payer: Self-pay

## 2019-10-12 NOTE — Telephone Encounter (Addendum)
See message from 06/23/19 and 07/23/19   Copied from CRM #[id number]. Topic: General - Other >> Oct 12, 2019  1:06 PM Dalphine Handing A wrote: Patients daughter would like record of when patient was first diagnosed with dementia, Best contact [phone] Marissa Nestle

## 2019-10-13 NOTE — Telephone Encounter (Signed)
Tried to contact patient's son to let him know that a letter was written 07/23/19 and has been available for pick up regarding the patient's medical condition that family needs for her insurance.

## 2019-10-21 NOTE — Telephone Encounter (Signed)
Returned call. Patient son Rosanne Ashing is requesting information be mailed to address in [place].

## 2019-10-29 ENCOUNTER — Telehealth: Payer: Self-pay

## 2019-10-29 NOTE — Telephone Encounter (Signed)
Copied from CRM #[id number]. Topic: General - Other >> Oct 29, 2019  3:44 PM Hudson, Caryn D wrote: Reason for CRM: [facility] stated they are receiving faxes from office but only 1 page. They did give the correct fax number because they are receiving faxes from other areas. 

## 2019-11-03 NOTE — Telephone Encounter (Signed)
Noted  

## 2019-11-03 NOTE — Telephone Encounter (Signed)
FYI Ms Blough is no longer a patient of ours.  Any correspondences would probably need to have an authorization at this point.

## 2019-11-03 NOTE — Telephone Encounter (Signed)
[facility] says to  Just stop sending faxes that they do not need anything and are getting upset faxes are coming into them

## 2019-11-09 ENCOUNTER — Telehealth: Payer: Self-pay

## 2019-11-09 NOTE — Telephone Encounter (Signed)
Copied from CRM [id number]. Topic: General - Other >> Oct 29, 2019  3:44 PM Lynne Logan D wrote: Reason for CRM: [facility] stated they are receiving faxes from office but only 1 page. They did give the correct fax number because they are receiving faxes from other areas. >> Nov 09, 2019  2:28 PM Daphine Deutscher D wrote: [facility] Quin with [facility] states they are still just getting the one page for the care plan and they need the whole thing re-faxed.  [phone]

## 2019-11-09 NOTE — Telephone Encounter (Signed)
Copied from CRM [id number]. Topic: General - Other >> Oct 29, 2019  3:44 PM Lynne Logan D wrote: Reason for CRM: [facility] stated they are receiving faxes from office but only 1 page. They did give the correct fax number because they are receiving faxes from other areas.

## 2019-11-09 NOTE — Telephone Encounter (Signed)
Spoke with Bonnie and was advised they received the full fax and do not need anything else faxed at this time. Thanks TNP 

## 2019-11-09 NOTE — Telephone Encounter (Signed)
Spoke with Lynn [name] and was advised they received the full fax and do not need anything else faxed at this time. Thanks TNP

## 2019-11-12 NOTE — Telephone Encounter (Signed)
Daughter in law Marylene Land calling because they NEVER received this letter concerning pt's mental health. It is supposed to be there for pick up. Please send to   [facility] [address] [place], [place]  [address]

## 2020-01-14 ENCOUNTER — Telehealth: Payer: Self-pay

## 2020-01-14 NOTE — Telephone Encounter (Signed)
Spoke with pt's daughter in law [place]. Marylene Land stated that pt's son, her husband is pt's POA. I advised that we would need a copy of the health care POA and an ROI signed by pt's POA or [facility] could send [place] an ROI requesting for continuity of care. Marylene Land was advised of our fax # and address. TNP

## 2020-01-14 NOTE — Telephone Encounter (Signed)
Please review request below for medical records. KW

## 2020-01-14 NOTE — Telephone Encounter (Signed)
Copied from CRM [id number]. Topic: General - Other >> Jan 14, 2020  9:25 AM Dalphine Handing A wrote: Patients family is requesting patients medical records be sent to [facility] making house calls to Lorayne Bender NP. Fax is  [phone]

## 2020-02-05 ENCOUNTER — Other Ambulatory Visit: Payer: Self-pay | Admitting: Family Medicine

## 2020-02-05 NOTE — Telephone Encounter (Signed)
Requested medication (s) are due for refill today: yes  Requested medication (s) are on the active medication list: yes  Last refill:  01/07/2020  Future visit scheduled: no  Notes to clinic:  this refill cannot be delegated Patient is overdue for follow up   Requested Prescriptions  Pending Prescriptions Disp Refills   cholecalciferol (VITAMIN D) 25 MCG (1000 UNIT) tablet [Pharmacy Med Name: VITAMIN D3 25 MCG (1000 UT) TAB] 30 tablet 11    Sig: TAKE 1 TABLET BY MOUTH ONCE DAILY FOR SUPPLEMENT      Endocrinology:  Vitamins - Vitamin D Supplementation Failed - 02/05/2020  1:25 PM      Failed - 50,000 IU strengths are not delegated      Failed - Ca in normal range and within 360 days    Calcium  Date Value Ref Range Status  05/07/2018 9.1 8.7 - 10.3 mg/dL Final   Calcium, Total  Date Value Ref Range Status  10/05/2014 8.5 (L) mg/dL Final    Comment:    3.9-76.7 NOTE: New Reference Range  09/14/14           Failed - Phosphate in normal range and within 360 days    No results found for: PHOS        Failed - Vitamin D in normal range and within 360 days    No results found for: [id number], [id number], [id number], 25OHVITD3, 25OHVITD2, 25OHVITD3, 25OHVITD2, 25OHVITD1, 25OHVITD2, 25OHVITD3, VD25OH        Failed - Valid encounter within last 12 months    Recent Outpatient Visits           1 year ago Alzheimer's dementia without behavioral disturbance, unspecified timing of dementia onset [facility])   [facility] Maple Hudson., MD   3 years ago Fall, initial encounter   [facility] Maple Hudson., MD   3 years ago Medicare annual wellness visit, subsequent   [facility] Maple Hudson., MD   3 years ago Alzheimer's dementia   [facility] Maple Hudson., MD   5 years ago Dehydration   [facility] [place], [place], [place]

## 2020-03-28 ENCOUNTER — Other Ambulatory Visit: Payer: Self-pay | Admitting: Family Medicine

## 2020-03-29 ENCOUNTER — Other Ambulatory Visit: Payer: Self-pay | Admitting: Family Medicine

## 2020-03-29 NOTE — Telephone Encounter (Signed)
Requested medication (s) are due for refill today expired rx  Requested medication (s) are on the active medication list -yes  Future visit scheduled -no  Last refill: 05/2018  Notes to clinic: Not sure if patient is current patient with office- another provider is listed as PCP- did not want to refuse if still patient of office.  Requested Prescriptions  Pending Prescriptions Disp Refills   DOCU 50 MG/5ML syrup [Pharmacy Med Name: DOCU 38 MG/5ML LIQ ML] 120 mL     Sig: GIVE Kyana 10 ML (2 TEASPOONSFUL) BY MOUTH DAILY FOR STOOL SOFTENER      Over the Counter:  OTC Failed - 03/29/2020 10:12 AM      Failed - Valid encounter within last 12 months    Recent Outpatient Visits           1 year ago Alzheimer's dementia without behavioral disturbance, unspecified timing of dementia onset [facility])   [facility] Maple Hudson., MD   3 years ago Fall, initial encounter   [facility] Maple Hudson., MD   3 years ago Medicare annual wellness visit, subsequent   [facility] Maple Hudson., MD   4 years ago Alzheimer's dementia   [facility] Maple Hudson., MD   5 years ago Dehydration   [facility] [place], [place], [place]                  Requested Prescriptions  Pending Prescriptions Disp Refills   DOCU 50 MG/5ML syrup [Pharmacy Med Name: DOCU 28 MG/5ML LIQ ML] 120 mL     Sig: GIVE Stephnie 10 ML (2 TEASPOONSFUL) BY MOUTH DAILY FOR STOOL SOFTENER      Over the Counter:  OTC Failed - 03/29/2020 10:12 AM      Failed - Valid encounter within last 12 months    Recent Outpatient Visits           1 year ago Alzheimer's dementia without behavioral disturbance, unspecified timing of dementia onset [facility])   [facility] Maple Hudson., MD   3 years ago Fall, initial encounter   [facility] Maple Hudson., MD   3 years ago Medicare annual  wellness visit, subsequent   [facility] Maple Hudson., MD   4 years ago Alzheimer's dementia   [facility] Maple Hudson., MD   5 years ago Dehydration   [facility] [place], [place]

## 2020-10-13 NOTE — Telephone Encounter (Signed)
Opened in error. KW °

## 2021-11-10 ENCOUNTER — Non-Acute Institutional Stay: Payer: Self-pay | Admitting: Student

## 2021-11-10 DIAGNOSIS — R52 Pain, unspecified: Secondary | ICD-10-CM

## 2021-11-10 DIAGNOSIS — G309 Alzheimer's disease, unspecified: Secondary | ICD-10-CM

## 2021-11-10 DIAGNOSIS — Z515 Encounter for palliative care: Secondary | ICD-10-CM

## 2021-11-10 NOTE — Progress Notes (Signed)
? ? ?Manufacturing engineer ?Community Palliative Care Consult Note ?Telephone: [phone]  ?Fax: [phone]  ? ?Date of encounter: 11/10/21 ? ?PATIENT NAME: Lynn [name] ?[place] [address]   ?[phone] (home)  ?DOB: [birth year] ?MRN: [id number] ?PRIMARY CARE PROVIDER:    ?Thea Gist, NP ? ?REFERRING PROVIDER:   ? ?Thea Gist, NP ?RESPONSIBLE PARTY:    ?Contact Information   ? ? Name Relation Home Work Mobile  ? Ella Jubilee Son [phone]    ? ?  ? ? ? ?I met face to face with patient in the facility. Palliative Care was asked to follow this patient by consultation request of  Thea Gist, NP to address advance care planning and complex medical decision making. This is the initial visit.  ? ? ?                                 ASSESSMENT AND PLAN / RECOMMENDATIONS:  ? ?Advance Care Planning/Goals of Care: Goals include to maximize quality of life and symptom management. Patient/health care surrogate gave his/her permission to discuss. ? ?CODE STATUS: DNR ? ?Education provided on Palliative Medicine vs. Hospice services. Will continue to monitor for changes and declines; will refer back to hospice should she decline and meet criteria.  ? ?Symptom Management/Plan: ? ?Alzheimer's dementia-FAST score 7E-patient is dependent for all adl's. She was recently discharged from hospice due to stability. Her appetite has improved. Will monitor for worsening symptoms, swallowing difficulty, infections. Palliative medicine will provide ongoing support and symptom management.  ? ?Pain-patient with generalized pain, hx of arthritis and chronic back pain; continue acetaminophen TID and tramadol every 12 hours.  ? ?Follow up Palliative Care Visit: Palliative care will continue to follow for complex medical decision making, advance care planning, and clarification of goals. Return in 4-6 weeks or prn. ? ? ?This visit was coded based on medical decision making (MDM). ? ?PPS:  30% ? ?HOSPICE ELIGIBILITY/DIAGNOSIS: TBD ? ?Chief Complaint: Palliative Medicine initial consult.  ? ?HISTORY OF PRESENT ILLNESS:  Lynn [name] is a 88 y.o. year old female  with Alzheimer's dementia, depression, weight loss, lower extremity edema, glaucoma, GERD, arthritis, chronic back pain. ? ?Patient recently discharged from hospice services due to stability. Patient is dependent for all adl's. She is out of bed daily to high back w/c; transferred with assist x 2. She is eating well per staff; fed by staff. She is receiving a mechanical soft diet. No skin breakdown; she does have contracture to right middle finger. No recent falls reported. Patient with arthritis, chronic back pain. HPI and ROS primarily obtained from staff due to her advanced dementia.  ? ?Patient received sitting up to w/c in common area. Few words are clear; echolalia noted. She does not exhibit any signs of discomfort.   ? ?History obtained from review of EMR, discussion with primary team, and interview with family, facility staff/caregiver and/or Ms. Thaxton.  ?I reviewed available labs, medications, imaging, studies and related documents from the EMR.  Records reviewed and summarized above.  ? ? ?Physical Exam: ?Pulse 68, resp 16, b/p 90/58 ?Constitutional: NAD ?General: frail appearing, thin ?EYES: anicteric sclera, lids intact, no discharge  ?ENMT: intact hearing, oral mucous membranes moist ?CV: S1S2, RRR, trace LE edema ?Pulmonary: LCTA, no increased work of breathing, no cough, room air ?Abdomen: normo-active BS + 4 quadrants, soft and non tender, no ascites ?GU: deferred ?MSK: +  sarcopenia, non- ambulatory ?Skin: warm and dry, no rashes; skin tear to left forearm, dressing CDI ?Neuro: +generalized weakness, alert, disoriented ?Psych: non-anxious affect ?Hem/lymph/immuno: no widespread bruising ?CURRENT PROBLEM LIST:  ?Patient Active Problem List  ? Diagnosis Date Noted  ? Decreased appetite 08/05/2018  ? Memory deficits 08/05/2018   ? Palliative care encounter 06/24/2018  ? Anorexia 06/24/2018  ? Memory loss 06/24/2018  ? Alzheimer's dementia ([place]) 02/11/2016  ? Cognitive disorder 01/01/2015  ? Arthritis 01/01/2015  ? Back pain, chronic 01/01/2015  ? Bladder cystocele 01/01/2015  ? Clinical depression 01/01/2015  ? Acid reflux 01/01/2015  ? Glaucoma 01/01/2015  ? Hypercholesteremia 01/01/2015  ? 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 Degeneration macular 01/01/2015  ? Osteopenia 01/01/2015  ? Need for prophylactic hormone replacement therapy (postmenopausal) 01/01/2015  ? 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 Scoliosis 01/01/2015  ? Closed fracture of neck of left femur with routine healing 11/20/2014  ? Hip fracture requiring operative repair ([place]) 11/20/2014  ? ?PAST MEDICAL HISTORY:  ?Active Ambulatory Problems  ?  Diagnosis Date Noted  ? Cognitive disorder 01/01/2015  ? Arthritis 01/01/2015  ? Back pain, chronic 01/01/2015  ? Bladder cystocele 01/01/2015  ? Clinical depression 01/01/2015  ? Acid reflux 01/01/2015  ? Glaucoma 01/01/2015  ? Hypercholesteremia 01/01/2015  ? 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 Degeneration macular 01/01/2015  ? Osteopenia 01/01/2015  ? Need for prophylactic hormone replacement therapy (postmenopausal) 01/01/2015  ? 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 Scoliosis 01/01/2015  ? Closed fracture of neck of left femur with routine healing 11/20/2014  ? Hip fracture requiring operative repair ([place]) 11/20/2014  ? Alzheimer's dementia ([place]) 02/11/2016  ? Palliative care encounter 06/24/2018  ? Anorexia 06/24/2018  ? Memory loss 06/24/2018  ? Decreased appetite 08/05/2018  ? Memory deficits 08/05/2018  ? ?Resolved Ambulatory Problems  ?  Diagnosis Date Noted  ? No Resolved Ambulatory Problems  ? ?Past Medical History:  ?Diagnosis Date  ? Dementia ([place])   ? Depression   ? High cholesterol   ? Macular degeneration   ? Reflux esophagitis   ? ?SOCIAL HX:  ?Social History  ? ?Tobacco Use  ? Smoking status: Never  ? Smokeless tobacco: Never  ?Substance  Use Topics  ? Alcohol use: No  ? ?FAMILY HX:  ?Family History  ?Problem Relation Age of Onset  ? Alzheimer's disease Mother   ? Pneumonia Father   ? Diabetes Father   ? Stroke Father   ? CVA Father   ? Blindness Brother   ?     legally blind  ? Hepatitis C Son   ?   ? ?ALLERGIES:  ?Allergies  ?Allergen Reactions  ? Sulfa Antibiotics Other (See Comments)  ?  Reaction: Pt does not remember.  ?   ?PERTINENT MEDICATIONS:  ?Outpatient Encounter Medications as of 11/10/2021  ?Medication Sig  ? acetaminophen (ACETAMINOPHEN EXTRA STRENGTH) 500 MG tablet TAKE 2 TABLETS (1000 MG) BY MOUTH 3 TIMES PER DAY  ? ALPRAZolam (XANAX) 0.25 MG tablet Take 1 tablet (0.25 mg total) by mouth 2 (two) times daily as needed for anxiety (or agitation).  ? ARIPiprazole (ABILIFY) 2 MG tablet TAKE 1 TABLET BY MOUTH ONCE DAILY AT BEDTIME  ? brinzolamide (AZOPT) 1 % ophthalmic suspension Place 1 drop into both eyes 2 (two) times daily.   ? Calcium Carbonate-Vitamin D 600-400 MG-UNIT tablet TAKE 1 TABLET BY MOUTH ONCE DAILY FOR SUPPLEMENT  ? cholecalciferol (VITAMIN D) 25 MCG (1000 UT) tablet TAKE 1 TABLET BY MOUTH ONCE DAILY FOR SUPPLEMENT  ?  Coenzyme Q10 (CO Q10) 100 MG CAPS Take 1 capsule by mouth daily.  ? docusate sodium (COLACE) 100 MG capsule TAKE 1 SOFTGEL BY MOUTH EVERY DAY TO SOFTEN STOOL  ? donepezil (ARICEPT) 10 MG tablet Take 1 tablet (10 mg total) by mouth daily.  ? GNP NATURAL FIBER 0.52 g capsule TAKE 2 CAPSULES BY MOUTH EACH MORNING FOR CONSTIPATION  ? meclizine (ANTIVERT) 25 MG tablet Take 25 mg by mouth every 8 (eight) hours as needed for dizziness.  ? mirtazapine (REMERON) 30 MG tablet TAKE 1 TABLET BY MOUTH ONCE DAILY AT BEDTIME FOR SLEEP OR AGITATION (Patient not taking: Reported on 12/26/2016)  ? Multiple Vitamin (MULTI-VITAMINS) TABS TAKE 1 TABLET BY MOUTH ONCE DAILY FOR SUPPLEMENT.  ? Multiple Vitamins-Minerals (PRESERVISION AREDS 2) CAPS Take by mouth.  ? omeprazole (PRILOSEC) 20 MG capsule TAKE 1 CAPSULE BY MOUTH ONCE EVERY  OTHERDAY FOR REFLUX  ? polyvinyl alcohol (LIQUIFILM TEARS) 1.4 % ophthalmic solution Place 1 drop into both eyes 2 (two) times daily.  ? sertraline (ZOLOFT) 50 MG tablet Take by mouth.  ? ?No facility-admini

## 2022-02-07 ENCOUNTER — Other Ambulatory Visit: Payer: Self-pay

## 2022-02-07 ENCOUNTER — Encounter: Payer: Self-pay | Admitting: Radiology

## 2022-02-07 ENCOUNTER — Emergency Department
Admission: EM | Admit: 2022-02-07 | Discharge: 2022-02-07 | Disposition: A | Discharge status: Skilled Nursing Facility | Payer: Medicare PPO | Attending: Emergency Medicine | Admitting: Emergency Medicine

## 2022-02-07 DIAGNOSIS — F0390 Unspecified dementia without behavioral disturbance: Secondary | ICD-10-CM | POA: Insufficient documentation

## 2022-02-07 DIAGNOSIS — R001 Bradycardia, unspecified: Secondary | ICD-10-CM | POA: Insufficient documentation

## 2022-02-07 DIAGNOSIS — R531 Weakness: Secondary | ICD-10-CM | POA: Diagnosis present

## 2022-02-07 DIAGNOSIS — R5383 Other fatigue: Secondary | ICD-10-CM

## 2022-02-07 DIAGNOSIS — I959 Hypotension, unspecified: Secondary | ICD-10-CM | POA: Insufficient documentation

## 2022-02-07 LAB — COMPREHENSIVE METABOLIC PANEL
ALT: 16 U/L (ref 0–44)
AST: 18 U/L (ref 15–41)
Albumin: 3 g/dL — ABNORMAL LOW (ref 3.5–5.0)
Alkaline Phosphatase: 65 U/L (ref 38–126)
Anion gap: 2 — ABNORMAL LOW (ref 5–15)
BUN: 17 mg/dL (ref 8–23)
CO2: 25 mmol/L (ref 22–32)
Calcium: 8.5 mg/dL — ABNORMAL LOW (ref 8.9–10.3)
Chloride: 115 mmol/L — ABNORMAL HIGH (ref 98–111)
Creatinine, Ser: 0.57 mg/dL (ref 0.44–1.00)
GFR, Estimated: >60 mL/min (ref >60)
Glucose, Bld: 125 mg/dL — ABNORMAL HIGH (ref 70–99)
Potassium: 4.2 mmol/L (ref 3.5–5.1)
Sodium: 142 mmol/L (ref 135–145)
Total Bilirubin: 0.7 mg/dL (ref 0.3–1.2)
Total Protein: 6.2 g/dL — ABNORMAL LOW (ref 6.5–8.1)

## 2022-02-07 LAB — TSH: TSH: 0.714 u[IU]/mL (ref 0.350–4.500)

## 2022-02-07 LAB — CBC WITH DIFFERENTIAL/PLATELET
Abs Immature Granulocytes: 0.02 10*3/uL (ref 0.00–0.07)
Basophils Absolute: 0.1 10*3/uL (ref 0.0–0.1)
Basophils Relative: 1 %
Eosinophils Absolute: 0.2 10*3/uL (ref 0.0–0.5)
Eosinophils Relative: 3 %
HCT: 34.9 % — ABNORMAL LOW (ref 36.0–46.0)
Hemoglobin: 11.2 g/dL — ABNORMAL LOW (ref 12.0–15.0)
Immature Granulocytes: 0 %
Lymphocytes Relative: 44 %
Lymphs Abs: 2.5 10*3/uL (ref 0.7–4.0)
MCH: 31.8 pg (ref 26.0–34.0)
MCHC: 32.1 g/dL (ref 30.0–36.0)
MCV: 99.1 fL (ref 80.0–100.0)
Monocytes Absolute: 0.5 10*3/uL (ref 0.1–1.0)
Monocytes Relative: 9 %
Neutro Abs: 2.4 10*3/uL (ref 1.7–7.7)
Neutrophils Relative %: 43 %
Platelets: 205 10*3/uL (ref 150–400)
RBC: 3.52 MIL/uL — ABNORMAL LOW (ref 3.87–5.11)
RDW: 13.6 % (ref 11.5–15.5)
WBC: 5.7 10*3/uL (ref 4.0–10.5)
nRBC: 0 % (ref 0.0–0.2)

## 2022-02-07 LAB — T4, FREE: Free T4: 0.81 ng/dL (ref 0.61–1.12)

## 2022-02-07 LAB — TROPONIN I (HIGH SENSITIVITY): Troponin I (High Sensitivity): 9 ng/L (ref <18)

## 2022-02-07 MED ORDER — SODIUM CHLORIDE 0.9 % IV BOLUS
500.0000 mL | Freq: Once | INTRAVENOUS | Status: AC
  Administered 2022-02-07: 500 mL via INTRAVENOUS

## 2022-02-07 NOTE — Progress Notes (Signed)
[facility] ed 13 [facility]) Hospital Liaison Note   Received request from MD/ Dr. Fuller Plan for hospice services at facility after discharge. Chart and patient information under review by [facility] physician. Hospice eligibility approved.   Spoke with son/Jim to initiate education related to hospice philosophy, services, and team approach to care. Rosanne Ashing verbalized understanding of information given. Per discussion, the plan is for patient to discharge home via [facility] once cleared to DC.    Denied DME needs.   Please send signed and completed DNR home with patient/family. Please provide prescriptions at discharge as needed to ensure ongoing symptom management.    [facility] information and contact numbers given to family & above information shared with TOC.   Please call with any questions/concerns.    Thank you for the opportunity to participate in this patient's care.   Odette Fraction, MSW [facility] Liaison  [phone]

## 2022-02-07 NOTE — ED Notes (Signed)
Pt brief changed.  

## 2022-02-07 NOTE — ED Triage Notes (Signed)
Pt states facility called due to pt declining over the past couple of days. Facility notes that she has also started leaning more to the left side  than she normally does. Pt has dementia at baseline. On arrival pt isnt very cooperative with this nurse to get vitals. Pt yells often and isnt oriented at all.

## 2022-02-07 NOTE — Discharge Instructions (Addendum)
Her work-up was reassuring based upon her blood work and I discussed CT imaging with her son and POA Rosanne Ashing and we have elected to hold off given unlikely to change management.  We are working on trying to get her engaged with hospice again.  I have a hospice coordinator named Odette Fraction who is working on trying to get hospice back involved.  If there are any other issues you can discuss further with the doctors at home place.  She can return to the ER if symptoms are are changing or there is a change in her mentation and family would like her further evaluated here

## 2022-02-07 NOTE — ED Provider Notes (Signed)
[facility] Provider Note    Event Date/Time   First MD Initiated Contact with Patient 02/07/22 [phone]     (approximate)   History   Weakness   HPI  Lynn [name] is a 88 y.o. female  Alzheimer's dementia, depression, weight loss, lower extremity edema, glaucoma, GERD, arthritis, chronic back pain., recently dc from hospice due to not meeting criteria. Pt coming from home place of [place].   According to the son had been on hospice for 2 years but then her condition had stabilized so taking off hospice. Today they noted she was more lethargic then normal, mumbling.  She is dependent on them for mobility and feeding. She is wheelchair dependent. No known falls.      Physical Exam   Triage Vital Signs: Blood pressure (!) 114/52, pulse (!) 44, temperature (!) 97.2 F (36.2 C), temperature source Rectal, resp. rate 17, height 5\' 1"  (1.549 m), weight 59.9 kg, SpO2 99 %.  Most recent vital signs: Vitals:   02/07/22 0934 02/07/22 0936  BP:  (!) 114/52  Pulse:  (!) 44  Resp:  17  Temp:  (!) 97.2 F (36.2 C)  SpO2: 95% 99%     General: Awake, no distress.  CV:  Good peripheral perfusion.  Resp:  Normal effort.  Abd:  No distention.  Soft and nontender Other:  Patient is moving her legs spontaneously.  She is moving her arms and trying to hold my arm with it.  She is not oriented but that sounds like baseline   ED Results / Procedures / Treatments   Labs (all labs ordered are listed, but only abnormal results are displayed) Labs Reviewed  CBC WITH DIFFERENTIAL/PLATELET - Abnormal; Notable for the following components:      Result Value   RBC 3.52 (*)    Hemoglobin 11.2 (*)    HCT 34.9 (*)    All other components within normal limits  COMPREHENSIVE METABOLIC PANEL  TSH  T4, FREE  URINALYSIS, ROUTINE W REFLEX MICROSCOPIC  TROPONIN I (HIGH SENSITIVITY)     EKG  My interpretation of EKG:  Sinus bradycardia rate of 49 without any ST  elevation or T wave inversions, normal intervals   PROCEDURES:  Critical Care performed: No  Procedures   MEDICATIONS ORDERED IN ED: Medications - No data to display   IMPRESSION / MDM / ASSESSMENT AND PLAN / ED COURSE  I reviewed the triage vital signs and the nursing notes.   Patient's presentation is most consistent with acute presentation with potential threat to life or bodily function.   Patient comes in with altered mental status with low blood pressures from the facility.  Consider Electra abnormalities, AKI, intercranial hemorrhage, stroke.  Attempted to call the son that is listed as a contact but they stated that it was the wrong number.  Got a list of patients POA and called a 04/09/22  10:19 AM discussed with patient's son about getting CT imaging evaluate for intracranial hemorrhage, stroke but given family's goals of care they are not sure if they would want any of this done.  Patient was previously on hospice and they want to try to focus on patient's comfort.  We will start off with blood work.  Patient's blood work is reassuring without any evidence of severe anemia, infection based upon the CBC.  BMP shows stable creatinine.  Slightly elevated chloride.  Her troponin is negative and thyroid is reassuring.  Discussed again with the patient's  POA son who understands the risk of missing an intracranial hemorrhage or stroke given patient's severe dementia and goals of care we will hold off on CT imaging.  11:43 AM I did call the patient's facility she was at baseline but seemed more lethargic and pale then normal. Blood pressure was low in 70s.. Pulse 54.  They called the POA and wanted her to be sent out. She was on hospice for 3 years. Need hospice order to change over [facility]. Her HR normal 60s.    It sounds that patient is acting more at her baseline self and her blood pressures have normalized after some fluids.  I have talked to a hospice  coordinator S. Reuel Boom who is working on trying to get her hospice restarted with [facility].  I was also given a number from the family of their prior [facility] hospice and left them a message.  I also called the facility he is okay taking her back to the facility and will get hospice involved tomorrow if they do not hear anything today given the doctor will be there tomorrow to help facilitate this.      FINAL CLINICAL IMPRESSION(S) / ED DIAGNOSES   Final diagnoses:  Hypotension, unspecified hypotension type  Lethargy     Rx / DC Orders   ED Discharge Orders     None        Note:  This document was prepared using Dragon voice recognition software and may include unintentional dictation errors.   Concha Se, MD 02/07/22 1213

## 2022-02-07 NOTE — ED Notes (Signed)
[facility]  called  for  transport  to  [facility]

## 2022-11-07 DEATH — deceased
